# Patient Record
Sex: Male | Born: 1935 | Race: White | Hispanic: No | Marital: Married | Smoking: Never smoker
Health system: Southern US, Community
[De-identification: ages and names within clinical notes are randomized; demographics above are authoritative.]

## PROBLEM LIST (undated history)

## (undated) DIAGNOSIS — C61 Malignant neoplasm of prostate: Secondary | ICD-10-CM

## (undated) DIAGNOSIS — C801 Malignant (primary) neoplasm, unspecified: Secondary | ICD-10-CM

## (undated) DIAGNOSIS — E079 Disorder of thyroid, unspecified: Secondary | ICD-10-CM

## (undated) HISTORY — PX: KNEE ARTHROSCOPY: SHX127

## (undated) HISTORY — DX: Malignant (primary) neoplasm, unspecified: C80.1

## (undated) HISTORY — PX: APPENDECTOMY: SHX54

## (undated) HISTORY — PX: TOTAL HIP ARTHROPLASTY: SHX124

## (undated) HISTORY — PX: PROSTATE SURGERY: SHX751

## (undated) HISTORY — PX: HERNIA REPAIR: SHX51

---

## 1999-11-10 ENCOUNTER — Encounter: Payer: Self-pay | Admitting: Emergency Medicine

## 1999-11-10 ENCOUNTER — Observation Stay (HOSPITAL_COMMUNITY): Admission: EM | Admit: 1999-11-10 | Discharge: 1999-11-11 | Payer: Self-pay | Admitting: Emergency Medicine

## 1999-11-10 ENCOUNTER — Encounter: Payer: Self-pay | Admitting: Orthopedic Surgery

## 2000-05-15 ENCOUNTER — Ambulatory Visit (HOSPITAL_COMMUNITY): Admission: EM | Admit: 2000-05-15 | Discharge: 2000-05-15 | Payer: Self-pay | Admitting: Emergency Medicine

## 2000-05-15 ENCOUNTER — Encounter: Payer: Self-pay | Admitting: Specialist

## 2000-05-15 ENCOUNTER — Encounter: Payer: Self-pay | Admitting: Emergency Medicine

## 2000-07-05 ENCOUNTER — Encounter: Payer: Self-pay | Admitting: Orthopedic Surgery

## 2000-07-13 ENCOUNTER — Inpatient Hospital Stay (HOSPITAL_COMMUNITY): Admission: RE | Admit: 2000-07-13 | Discharge: 2000-07-16 | Payer: Self-pay | Admitting: Orthopedic Surgery

## 2002-02-28 ENCOUNTER — Encounter: Payer: Self-pay | Admitting: Orthopedic Surgery

## 2002-02-28 ENCOUNTER — Ambulatory Visit (HOSPITAL_COMMUNITY): Admission: EM | Admit: 2002-02-28 | Discharge: 2002-02-28 | Payer: Self-pay | Admitting: Emergency Medicine

## 2002-02-28 ENCOUNTER — Encounter: Payer: Self-pay | Admitting: Emergency Medicine

## 2003-05-29 ENCOUNTER — Encounter: Payer: Self-pay | Admitting: Emergency Medicine

## 2003-05-29 ENCOUNTER — Ambulatory Visit (HOSPITAL_COMMUNITY): Admission: EM | Admit: 2003-05-29 | Discharge: 2003-05-29 | Payer: Self-pay | Admitting: Emergency Medicine

## 2003-07-04 ENCOUNTER — Inpatient Hospital Stay (HOSPITAL_COMMUNITY): Admission: RE | Admit: 2003-07-04 | Discharge: 2003-07-06 | Payer: Self-pay | Admitting: Orthopedic Surgery

## 2010-03-17 ENCOUNTER — Ambulatory Visit (HOSPITAL_COMMUNITY): Admission: RE | Admit: 2010-03-17 | Discharge: 2010-03-17 | Payer: Self-pay | Admitting: Orthopedic Surgery

## 2010-12-08 ENCOUNTER — Inpatient Hospital Stay (HOSPITAL_COMMUNITY)
Admission: EM | Admit: 2010-12-08 | Discharge: 2010-12-24 | DRG: 338 | Disposition: A | Discharge status: Home Health | Payer: Medicare Other | Attending: General Surgery | Admitting: General Surgery

## 2010-12-08 ENCOUNTER — Emergency Department (HOSPITAL_COMMUNITY): Payer: Medicare Other

## 2010-12-08 DIAGNOSIS — I4891 Unspecified atrial fibrillation: Secondary | ICD-10-CM | POA: Diagnosis not present

## 2010-12-08 DIAGNOSIS — J9600 Acute respiratory failure, unspecified whether with hypoxia or hypercapnia: Secondary | ICD-10-CM | POA: Diagnosis not present

## 2010-12-08 DIAGNOSIS — R6520 Severe sepsis without septic shock: Secondary | ICD-10-CM | POA: Diagnosis present

## 2010-12-08 DIAGNOSIS — E876 Hypokalemia: Secondary | ICD-10-CM | POA: Diagnosis present

## 2010-12-08 DIAGNOSIS — D510 Vitamin B12 deficiency anemia due to intrinsic factor deficiency: Secondary | ICD-10-CM | POA: Diagnosis present

## 2010-12-08 DIAGNOSIS — N179 Acute kidney failure, unspecified: Secondary | ICD-10-CM | POA: Diagnosis present

## 2010-12-08 DIAGNOSIS — K3533 Acute appendicitis with perforation and localized peritonitis, with abscess: Principal | ICD-10-CM | POA: Diagnosis present

## 2010-12-08 DIAGNOSIS — I10 Essential (primary) hypertension: Secondary | ICD-10-CM | POA: Diagnosis present

## 2010-12-08 DIAGNOSIS — K929 Disease of digestive system, unspecified: Secondary | ICD-10-CM | POA: Diagnosis not present

## 2010-12-08 DIAGNOSIS — K560 Paralytic ileus: Secondary | ICD-10-CM | POA: Diagnosis not present

## 2010-12-08 DIAGNOSIS — Z880 Allergy status to penicillin: Secondary | ICD-10-CM

## 2010-12-08 DIAGNOSIS — J189 Pneumonia, unspecified organism: Secondary | ICD-10-CM | POA: Diagnosis present

## 2010-12-08 DIAGNOSIS — A419 Sepsis, unspecified organism: Secondary | ICD-10-CM | POA: Diagnosis present

## 2010-12-08 DIAGNOSIS — E039 Hypothyroidism, unspecified: Secondary | ICD-10-CM | POA: Diagnosis present

## 2010-12-08 LAB — LACTIC ACID, PLASMA: Lactic Acid, Venous: 2.3 mmol/L — ABNORMAL HIGH (ref 0.5–2.2)

## 2010-12-08 LAB — COMPREHENSIVE METABOLIC PANEL
ALT: 17 U/L (ref 0–53)
Alkaline Phosphatase: 67 U/L (ref 39–117)
CO2: 22 mEq/L (ref 19–32)
Calcium: 8.7 mg/dL (ref 8.4–10.5)
GFR calc non Af Amer: 46 mL/min — ABNORMAL LOW (ref >60)
Glucose, Bld: 111 mg/dL — ABNORMAL HIGH (ref 70–99)
Sodium: 136 mEq/L (ref 135–145)
Total Bilirubin: 1.3 mg/dL — ABNORMAL HIGH (ref 0.3–1.2)

## 2010-12-08 LAB — COMPREHENSIVE METABOLIC PANEL WITH GFR
AST: 30 U/L (ref 0–37)
Albumin: 3.1 g/dL — ABNORMAL LOW (ref 3.5–5.2)
BUN: 20 mg/dL (ref 6–23)
Chloride: 103 meq/L (ref 96–112)
Creatinine, Ser: 1.47 mg/dL (ref 0.4–1.5)
GFR calc Af Amer: 55 mL/min — ABNORMAL LOW (ref >60)
Potassium: 3.4 meq/L — ABNORMAL LOW (ref 3.5–5.1)
Total Protein: 6.6 g/dL (ref 6.0–8.3)

## 2010-12-08 LAB — PROTIME-INR
INR: 1.47 (ref 0.00–1.49)
Prothrombin Time: 18 s — ABNORMAL HIGH (ref 11.6–15.2)

## 2010-12-08 LAB — CBC
HCT: 35.6 % — ABNORMAL LOW (ref 39.0–52.0)
Hemoglobin: 12.2 g/dL — ABNORMAL LOW (ref 13.0–17.0)
MCH: 30.6 pg (ref 26.0–34.0)
MCHC: 34.3 g/dL (ref 30.0–36.0)
MCV: 89.2 fL (ref 78.0–100.0)
Platelets: 206 10*3/uL (ref 150–400)
RBC: 3.99 MIL/uL — ABNORMAL LOW (ref 4.22–5.81)
RDW: 14.3 % (ref 11.5–15.5)
WBC: 23.4 K/uL — ABNORMAL HIGH (ref 4.0–10.5)

## 2010-12-08 LAB — BRAIN NATRIURETIC PEPTIDE: Pro B Natriuretic peptide (BNP): 422 pg/mL — ABNORMAL HIGH (ref 0.0–100.0)

## 2010-12-08 LAB — CK TOTAL AND CKMB (NOT AT ARMC)
CK, MB: 2.8 ng/mL (ref 0.3–4.0)
Relative Index: 1.6 (ref 0.0–2.5)
Total CK: 179 U/L (ref 7–232)

## 2010-12-08 LAB — DIFFERENTIAL
Basophils Absolute: 0 K/uL (ref 0.0–0.1)
Basophils Relative: 0 % (ref 0–1)
Eosinophils Absolute: 0 10*3/uL (ref 0.0–0.7)
Eosinophils Relative: 0 % (ref 0–5)
Lymphocytes Relative: 2 % — ABNORMAL LOW (ref 12–46)
Lymphs Abs: 0.5 K/uL — ABNORMAL LOW (ref 0.7–4.0)
Monocytes Absolute: 1.4 K/uL — ABNORMAL HIGH (ref 0.1–1.0)
Monocytes Relative: 6 % (ref 3–12)
Neutro Abs: 21.5 10*3/uL — ABNORMAL HIGH (ref 1.7–7.7)
Neutrophils Relative %: 92 % — ABNORMAL HIGH (ref 43–77)
WBC Morphology: INCREASED

## 2010-12-08 LAB — APTT: aPTT: 31 seconds (ref 24–37)

## 2010-12-08 LAB — PROCALCITONIN: Procalcitonin: 34.28 ng/mL

## 2010-12-08 LAB — TROPONIN I: Troponin I: 0.09 ng/mL — ABNORMAL HIGH (ref 0.00–0.06)

## 2010-12-08 LAB — TSH: TSH: 0.925 u[IU]/mL (ref 0.350–4.500)

## 2010-12-08 LAB — MRSA PCR SCREENING: MRSA by PCR: NEGATIVE

## 2010-12-09 ENCOUNTER — Other Ambulatory Visit: Payer: Self-pay | Admitting: General Surgery

## 2010-12-09 ENCOUNTER — Encounter (HOSPITAL_COMMUNITY): Payer: Self-pay | Admitting: Radiology

## 2010-12-09 ENCOUNTER — Inpatient Hospital Stay (HOSPITAL_COMMUNITY): Payer: Medicare Other

## 2010-12-09 DIAGNOSIS — K3580 Unspecified acute appendicitis: Secondary | ICD-10-CM

## 2010-12-09 DIAGNOSIS — A419 Sepsis, unspecified organism: Secondary | ICD-10-CM

## 2010-12-09 DIAGNOSIS — R579 Shock, unspecified: Secondary | ICD-10-CM

## 2010-12-09 LAB — BASIC METABOLIC PANEL
BUN: 23 mg/dL (ref 6–23)
Chloride: 107 mEq/L (ref 96–112)
Glucose, Bld: 114 mg/dL — ABNORMAL HIGH (ref 70–99)
Potassium: 3.3 mEq/L — ABNORMAL LOW (ref 3.5–5.1)
Sodium: 136 mEq/L (ref 135–145)

## 2010-12-09 LAB — CARDIAC PANEL(CRET KIN+CKTOT+MB+TROPI)
CK, MB: 4.2 ng/mL — ABNORMAL HIGH (ref 0.3–4.0)
CK, MB: 4.5 ng/mL — ABNORMAL HIGH (ref 0.3–4.0)
CK, MB: 4.2 ng/mL — ABNORMAL HIGH (ref 0.3–4.0)
Relative Index: 1.7 (ref 0.0–2.5)
Total CK: 248 U/L — ABNORMAL HIGH (ref 7–232)
Total CK: 181 U/L (ref 7–232)
Troponin I: 0.07 ng/mL — ABNORMAL HIGH (ref 0.00–0.06)
Troponin I: 0.07 ng/mL — ABNORMAL HIGH (ref 0.00–0.06)
Troponin I: 0.06 ng/mL (ref 0.00–0.06)

## 2010-12-09 LAB — EXPECTORATED SPUTUM ASSESSMENT W GRAM STAIN, RFLX TO RESP C

## 2010-12-09 LAB — LIPID PANEL
Cholesterol: 76 mg/dL (ref 0–200)
HDL: 36 mg/dL — ABNORMAL LOW (ref >39)

## 2010-12-09 LAB — URINALYSIS, ROUTINE W REFLEX MICROSCOPIC
Bilirubin Urine: NEGATIVE
Hgb urine dipstick: NEGATIVE
Ketones, ur: NEGATIVE mg/dL
Protein, ur: NEGATIVE mg/dL
Urobilinogen, UA: 0.2 mg/dL (ref 0.0–1.0)

## 2010-12-09 LAB — CBC
HCT: 32 % — ABNORMAL LOW (ref 39.0–52.0)
MCV: 89.1 fL (ref 78.0–100.0)
Platelets: 199 10*3/uL (ref 150–400)
RBC: 3.59 MIL/uL — ABNORMAL LOW (ref 4.22–5.81)
WBC: 23.8 10*3/uL — ABNORMAL HIGH (ref 4.0–10.5)

## 2010-12-09 LAB — BRAIN NATRIURETIC PEPTIDE: Pro B Natriuretic peptide (BNP): 268 pg/mL — ABNORMAL HIGH (ref 0.0–100.0)

## 2010-12-09 LAB — TYPE AND SCREEN

## 2010-12-09 LAB — SURGICAL PCR SCREEN: MRSA, PCR: NEGATIVE

## 2010-12-09 MED ORDER — IOHEXOL 300 MG/ML  SOLN: 80.0000 mL | Freq: Once | INTRAMUSCULAR | Status: AC | PRN

## 2010-12-10 ENCOUNTER — Inpatient Hospital Stay (HOSPITAL_COMMUNITY): Payer: Medicare Other

## 2010-12-10 LAB — CARDIAC PANEL(CRET KIN+CKTOT+MB+TROPI)
CK, MB: 2.6 ng/mL (ref 0.3–4.0)
CK, MB: 2.6 ng/mL (ref 0.3–4.0)
Relative Index: 1.6 (ref 0.0–2.5)
Troponin I: 0.05 ng/mL (ref 0.00–0.06)

## 2010-12-10 LAB — COMPREHENSIVE METABOLIC PANEL
Alkaline Phosphatase: 52 U/L (ref 39–117)
BUN: 15 mg/dL (ref 6–23)
Calcium: 7.7 mg/dL — ABNORMAL LOW (ref 8.4–10.5)
Creatinine, Ser: 1.01 mg/dL (ref 0.4–1.5)
Glucose, Bld: 115 mg/dL — ABNORMAL HIGH (ref 70–99)
Total Protein: 4.8 g/dL — ABNORMAL LOW (ref 6.0–8.3)

## 2010-12-10 LAB — DIFFERENTIAL
Eosinophils Relative: 0 % (ref 0–5)
Lymphocytes Relative: 7 % — ABNORMAL LOW (ref 12–46)
Lymphs Abs: 0.7 10*3/uL (ref 0.7–4.0)
Monocytes Absolute: 0.6 10*3/uL (ref 0.1–1.0)

## 2010-12-10 LAB — MAGNESIUM: Magnesium: 1.6 mg/dL (ref 1.5–2.5)

## 2010-12-10 LAB — PHOSPHORUS: Phosphorus: 2.5 mg/dL (ref 2.3–4.6)

## 2010-12-10 LAB — CBC
HCT: 31.3 % — ABNORMAL LOW (ref 39.0–52.0)
MCH: 31.2 pg (ref 26.0–34.0)
MCHC: 35.1 g/dL (ref 30.0–36.0)
MCV: 88.7 fL (ref 78.0–100.0)
RDW: 14.9 % (ref 11.5–15.5)

## 2010-12-10 LAB — BRAIN NATRIURETIC PEPTIDE: Pro B Natriuretic peptide (BNP): 664 pg/mL — ABNORMAL HIGH (ref 0.0–100.0)

## 2010-12-10 LAB — URINE CULTURE: Culture  Setup Time: 201204100901

## 2010-12-10 LAB — GLUCOSE, CAPILLARY: Glucose-Capillary: 133 mg/dL — ABNORMAL HIGH (ref 70–99)

## 2010-12-11 ENCOUNTER — Inpatient Hospital Stay (HOSPITAL_COMMUNITY): Payer: Medicare Other

## 2010-12-11 LAB — BASIC METABOLIC PANEL
CO2: 24 mEq/L (ref 19–32)
Calcium: 8.1 mg/dL — ABNORMAL LOW (ref 8.4–10.5)
Creatinine, Ser: 1.08 mg/dL (ref 0.4–1.5)
Glucose, Bld: 127 mg/dL — ABNORMAL HIGH (ref 70–99)

## 2010-12-11 LAB — CARDIAC PANEL(CRET KIN+CKTOT+MB+TROPI): CK, MB: 2.7 ng/mL (ref 0.3–4.0)

## 2010-12-11 LAB — CBC
HCT: 33.4 % — ABNORMAL LOW (ref 39.0–52.0)
MCHC: 33.8 g/dL (ref 30.0–36.0)
MCV: 87.7 fL (ref 78.0–100.0)
RDW: 14.7 % (ref 11.5–15.5)

## 2010-12-11 LAB — DIFFERENTIAL
Eosinophils Relative: 1 % (ref 0–5)
Lymphocytes Relative: 6 % — ABNORMAL LOW (ref 12–46)
Lymphs Abs: 0.6 10*3/uL — ABNORMAL LOW (ref 0.7–4.0)
Monocytes Absolute: 0.7 10*3/uL (ref 0.1–1.0)
Monocytes Relative: 7 % (ref 3–12)

## 2010-12-11 LAB — PROCALCITONIN: Procalcitonin: 4.44 ng/mL

## 2010-12-11 NOTE — H&P (Signed)
NAME:  Jordan [name], Jordan [name]               ACCOUNT NO.:  [id number]  MEDICAL RECORD NO.:  [id number]           PATIENT TYPE:  I  LOCATION:  2915                         FACILITY:  [facility]  PHYSICIAN:  Hillery Aldo, M.D.   DATE OF BIRTH:  Jun 11, 1924  DATE OF ADMISSION:  12/08/2010 DATE OF DISCHARGE:                             HISTORY & PHYSICAL   PRIMARY CARE PROVIDER:  Tommas Olp at [facility] medical specialist.  CHIEF COMPLAINT:  Chest discomfort.  HISTORY OF PRESENT ILLNESS:  The patient is an 77 year old male who was sent here from [facility] with a diagnosis of pneumonia and elevated troponins.  Because [facility] does not have a cardiac cath lab or capabilities to deal with acute coronary syndrome patients, the patient was transferred here for evaluation.  The patient is weak, but states that his symptoms began approximately 24 hours ago with what he describes as a low grade epigastric pain and pain in the lower chest area.  He denies any associated shortness of breath or fever, but notes that he did have some chilling last night.  He has a nonproductive cough that is mild.  Denies any sick contacts.  The patient states that he also had two episodes of nausea and vomiting last night for which he took Pepto-Bismol and no associated diarrhea.  The patient currently is denying any chest pain, but looks mildly toxic.  He is being admitted for treatment of sepsis secondary to community-acquired pneumonia.  PAST MEDICAL HISTORY: 1. Hypothyroidism. 2. Prostate cancer, status post prostatectomy in 1989. 3. Pernicious anemia. 4. History of pneumonia. 5. History of shingles. 6. History of jaundice. 7. Status post right knee replacement in 1949. 8. Herniorrhaphy x2. 9. Left total hip replacement in 2000 followed by revision in 2001 and     then conversion to a constrained modality in 2004.  FAMILY HISTORY:  The patient's father died at 77 from colon cancer.  The patient's  mother died at 70 from old age.  He has one brother who he thinks died of a stroke and a sister who is alive.  SOCIAL HISTORY:  The patient is married.  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  He is a lifelong nonsmoker with no history of alcohol or drug abuse.  ALLERGIES: 1. PENICILLIN causes a rash. 2. TETANUS (horse serum).  CURRENT MEDICATIONS:  The patient is unable to state, but the records say that he is on betaxolol, vitamin B12 shots, and Synthroid at uncertain doses.  REVIEW OF SYSTEMS:  CONSTITUTIONAL:  No changes in his appetite.  No weight loss or weight gain.  Poor energy for the last several days. HEENT:  Denies pharyngitis.  Denies visual problems or hearing problems. CARDIOVASCULAR:  Mild chest discomfort in the past 24 hours, none present.  No palpitations.  RESPIRATORY:  No shortness of breath.  Mild nonproductive cough.  GI:  No current nausea or vomiting, but he did have two episodes last evening.  No diarrhea, melena, or hematochezia. No hematemesis.  GU:  No dysuria or hematuria.  A comprehensive 14-point review of systems is  otherwise unremarkable except as noted above.  PHYSICAL EXAMINATION:  VITAL SIGNS:  Temperature 98.4, pulse 96, respirations 14, blood pressure 95/53, O2 saturation 94% on room air. GENERAL:  Mildly toxic Caucasian male who is in no acute distress. HEENT:  Normocephalic, atraumatic.  PERRL.  EOMI.  Oropharynx reveals some mild posterior pharyngeal erythema, but is otherwise unremarkable. Mucous membranes are moist. NECK:  Supple, no thyromegaly, no lymphadenopathy, no jugular venous distention. CHEST:  Diminished breath sounds, clear bilaterally. HEART:  Regular rate, rhythm.  No murmurs, rubs, or gallops. ABDOMEN:  Soft, nontender, nondistended with normoactive bowel sounds. EXTREMITIES:  Trace edema. SKIN:  Dry.  No rashes. NEUROLOGIC:  The patient is alert and oriented x3.  Cranial nerves  II through XII grossly intact.  Generalized weakness.  Nonfocal.  DATA REVIEW:  Chest x-ray shows mild interstitial prominence in the medial right upper lobe which is atypical, but might represent a mild pneumonia.  A 12-lead EKG shows normal sinus rhythm at 67 beats per minute with no ST or T-wave abnormalities appreciated.  LABORATORY DATA:  BNP was 422.  White blood cell count is 23.4, hemoglobin 12.2, hematocrit 35.6, platelets 206.  Sodium is 136, potassium 3.4, chloride 103, bicarb 22, BUN 20, creatinine 1.47, glucose 111, calcium 8.7, total bilirubin 1.3, alkaline phosphatase 67, AST 30, ALT 17, total protein 6.6, albumin 3.1.  Cardiac markers are significant for an elevated troponin of 0.09.  CK is 179 and MB is 2.5.  PT is 18, PTT 31.  Lactic acid is 2.3 and procalcitonin is 34.28.  ASSESSMENT AND PLAN: 1. Sepsis secondary to community-acquired pneumonia:  We will admit     the patient to the step-down unit and cover him with Avelox since     he has not had any healthcare exposure.  We will obtain two sets of     blood cultures.  The patient is not currently producing sputum, but     if he does, we would collect sputum specimen.  We will monitor him     closely for signs of resolution of his sepsis syndrome by     rechecking a procalcitonin level in the morning. 2. Elevated troponin secondary to demand ischemia:  The patient likely     has a troponin leak from his sepsis and underlying demand ischemia.     We will place the patient on aspirin therapy and cycle cardiac     markers q.6 h. x3 sets.  We will check a two-dimensional     echocardiogram to evaluate his left ventricular function.  We will     check a fasting lipid panel to further risk stratify him. 3. Elevated BNP:  Again, the patient likely has demand ischemia and     some BNP elevation secondary to diastolic heart failure from sepsis     and demand ischemia.  At this point, we will check a two-     dimensional  echocardiogram to rule out systolic dysfunction and     recheck a BNP in the morning.  The patient does not appear to be     clinically volume overloaded and would gently hydrate him. 4. Acute renal failure versus stage III chronic kidney disease:  The     patient's baseline renal function is not currently known.  We will     gently hydrate him and monitor his creatinine closely with gentle     hydration. 5. Hypokalemia:  We will place potassium in the patient's IV fluids  and monitor his electrolytes closely. 6. Hypothyroidism:  We will resume the patient's usual dose of     Synthroid once it has been verified and check a TSH level. 7. Pernicious anemia:  Appears to be well controlled given his     hemoglobin of 12.2. 8. Prophylaxis:  We will place the patient on Lovenox for deep venous     thrombosis prophylaxis.  Time spent on admission including face-to-face time equals approximately 1 hour.     Hillery Aldo, M.D.     CR/MEDQ  D:  12/08/2010  T:  12/09/2010  Job:  [id number]  cc:   Tommas Olp  Electronically Signed by Hillery Aldo M.D. on 12/11/2010 07:20:13 AM

## 2010-12-12 LAB — DIFFERENTIAL
Basophils Relative: 0 % (ref 0–1)
Lymphs Abs: 1 10*3/uL (ref 0.7–4.0)
Monocytes Relative: 9 % (ref 3–12)
Neutro Abs: 7.5 10*3/uL (ref 1.7–7.7)
Neutrophils Relative %: 75 % (ref 43–77)

## 2010-12-12 LAB — COMPREHENSIVE METABOLIC PANEL
ALT: 14 U/L (ref 0–53)
AST: 19 U/L (ref 0–37)
Albumin: 1.9 g/dL — ABNORMAL LOW (ref 3.5–5.2)
CO2: 24 mEq/L (ref 19–32)
Calcium: 7.8 mg/dL — ABNORMAL LOW (ref 8.4–10.5)
Creatinine, Ser: 0.92 mg/dL (ref 0.4–1.5)
GFR calc Af Amer: >60 mL/min (ref >60)
GFR calc non Af Amer: >60 mL/min (ref >60)
Sodium: 139 mEq/L (ref 135–145)
Total Protein: 5.1 g/dL — ABNORMAL LOW (ref 6.0–8.3)

## 2010-12-12 LAB — CBC
Hemoglobin: 10.9 g/dL — ABNORMAL LOW (ref 13.0–17.0)
MCH: 30 pg (ref 26.0–34.0)
RBC: 3.63 MIL/uL — ABNORMAL LOW (ref 4.22–5.81)
WBC: 10 10*3/uL (ref 4.0–10.5)

## 2010-12-12 LAB — BODY FLUID CULTURE

## 2010-12-13 LAB — CBC
HCT: 32.6 % — ABNORMAL LOW (ref 39.0–52.0)
Hemoglobin: 11 g/dL — ABNORMAL LOW (ref 13.0–17.0)
MCH: 29.6 pg (ref 26.0–34.0)
MCHC: 33.7 g/dL (ref 30.0–36.0)
MCV: 87.9 fL (ref 78.0–100.0)
RDW: 14.7 % (ref 11.5–15.5)

## 2010-12-13 LAB — RENAL FUNCTION PANEL
BUN: 16 mg/dL (ref 6–23)
CO2: 25 mEq/L (ref 19–32)
Calcium: 7.9 mg/dL — ABNORMAL LOW (ref 8.4–10.5)
Creatinine, Ser: 0.88 mg/dL (ref 0.4–1.5)
Glucose, Bld: 111 mg/dL — ABNORMAL HIGH (ref 70–99)
Phosphorus: 2.5 mg/dL (ref 2.3–4.6)

## 2010-12-14 ENCOUNTER — Inpatient Hospital Stay (HOSPITAL_COMMUNITY): Payer: Medicare Other

## 2010-12-14 LAB — ANAEROBIC CULTURE

## 2010-12-15 ENCOUNTER — Inpatient Hospital Stay (HOSPITAL_COMMUNITY): Payer: Medicare Other

## 2010-12-15 LAB — CULTURE, BLOOD (ROUTINE X 2)
Culture  Setup Time: 201204100417
Culture  Setup Time: 201204100417
Culture: NO GROWTH
Culture: NO GROWTH

## 2010-12-15 LAB — RENAL FUNCTION PANEL
Albumin: 2.3 g/dL — ABNORMAL LOW (ref 3.5–5.2)
BUN: 9 mg/dL (ref 6–23)
Chloride: 107 mEq/L (ref 96–112)
Glucose, Bld: 119 mg/dL — ABNORMAL HIGH (ref 70–99)
Phosphorus: 3.2 mg/dL (ref 2.3–4.6)
Potassium: 4 mEq/L (ref 3.5–5.1)
Sodium: 138 mEq/L (ref 135–145)

## 2010-12-15 LAB — CBC
HCT: 37.6 % — ABNORMAL LOW (ref 39.0–52.0)
MCH: 29.8 pg (ref 26.0–34.0)
MCV: 88.3 fL (ref 78.0–100.0)
Platelets: 450 10*3/uL — ABNORMAL HIGH (ref 150–400)
RDW: 14.9 % (ref 11.5–15.5)
WBC: 11.7 10*3/uL — ABNORMAL HIGH (ref 4.0–10.5)

## 2010-12-15 LAB — CARDIAC PANEL(CRET KIN+CKTOT+MB+TROPI): Total CK: 27 U/L (ref 7–232)

## 2010-12-15 NOTE — Op Note (Signed)
NAME:  [name], Jordan               ACCOUNT NO.:  [id number]  MEDICAL RECORD NO.:  [id number]           PATIENT TYPE:  I  LOCATION:  2915                         FACILITY:  [facility]  PHYSICIAN:  Anselm Pancoast. Svea Pusch, M.D.DATE OF BIRTH:  1924-02-19  DATE OF PROCEDURE:  12/08/2010 DATE OF DISCHARGE:                              OPERATIVE REPORT   PREOPERATIVE DIAGNOSIS:  Appendicitis, probably ruptured.  POSTOPERATIVE DIAGNOSIS:  Ruptured appendicitis with generalized peritonitis.  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 Mikey Bussing had seen him and the Family request of [facility].  He arrived here having had a electrolytes at Dr. Neita Garnet office, [facility] and his creatinine was 1.6, white count 17,600, and he is a nonsmoker and complained of some shortness of breath or kind of pain with breathing and some difficulty with urinating, he has had a previous prostate surgery for cancer a year ago and arrived here in the ER approximately 2:00 p.m. on Monday, he was hypotensive at the time of blood pressure of 91/51, a pulse of 68, then IV fluids were started. His pressure hovered around basically a 100 range and he was evaluated by the ER physician and I think they thought he had a community-acquired pneumonia, anyway he was admitted by the hospitalist and they started him on I think Cipro and Flagyl.  He is allergic to PENICILLIN and then obtained a CT whether, I do not know it was whether it was last night or early this morning, that showed obviously swollen appendix and fluid consistent with appendicitis.  Surgical consultation was requested.  He was seen by Dr. Abbey Chatters  and the PAs earlier today.  They had fed him and Dr. Abbey Chatters has a kind of opinion that it was not really of emergency, he of course has been had a soft blood pressure.  They had done an echocardiogram and since he had eaten, they were not able to take him to the operating room right at that time.  As Dr. Abbey Chatters put him n.p.o. and suggested that after a 6-hour await that we will proceed our surgery.  Dr. Magnus Ivan asked me that we will do the surgery since he is recently injured his wrist and thought to pull in would be difficult.  On physical exam, the patient is very tender in the lower abdomen, predominantly more so on the right and review of the CT there is a lot of fluid and this looks like probably a ruptured appendicitis.  I took him back to the OR suite.  Induction of general anesthesia, endotracheal tube, oral tube was placed into the stomach.  Foley catheter was inserted sterilely and then the abdomen was prepped and draped in a sterile manner.  I made a little small incision below the umbilicus down through  the fascia above where he had his open prostatectomy and then I carefully entered into the peritoneal cavity, but and making a little incision it was obviously generalized peritoneal infections fluid after I opened into the peritoneal cavity.  I put Hasson cannula held in place with a pursestring and then put the camera and we were able to just to see an markedly inflamed lot of issues throughout the peritoneal cavity and I think that it would be better to go ahead and just opened the midline incision.  Dr. Magnus Ivan scrubbed in at this point and using the cautery we opened the midline incision probably about a 3-inch length the fascia that because all which previous surgery unfortunately has some adhesions up to the undersurface and then I could get my fingers in.  We could feel the markedly inflamed appendix and there was a fecalith right at the base where he  has ruptured appendix.  We took cultures aerobic and anaerobically of the peritoneal fluid and then with kind of finger manipulated the mesentery of the appendix and kind of pushed the loops of small bowel away and then put Kelly's on the mesentery of the appendix.  This was tied with 2- 0 Vicryl and then we got more proximal where the little perforation was and then clamped the base of the appendix with a Tresa Endo.  I put 2 free ties of 2-0 Vicryl, removed the ruptured appendix, and then put a pursestring 2-0 silk to stump inverted with a pursestring suture tie.  I put a second little stitch to kind of reinforced the area and then this thoroughly irrigated with by 4 liters of saline not warm since when we induced with general anesthesia, the nurse had measured him with an temperature of 104.  His temperature was down about 102 at the completion of surgery.  Estimated blood loss was minimal.  Sponge and needle counts were correct and then after breaking up loculations up above the liver on the right and up in the around the stomach make sure that the nasogastric tube is within the stomach and then put a 19-Blake in the right lower quadrant just because I think he will have more fluid draining out because of all the washing and it is hard to get all the fluid out.  I then closed the midline fascia with single interrupted sutures of #1 Novafil.  The wound was left open and packed with Betadine- soaked gauze 4x4s.  The patient's wife says she is not sure exactly why or how he is allergic to penicillin and since he has been on Cipro now about 2 days and still running such febrile.  I wonder if Primaxin might be a better drug in this 77 year old with this generalized peritonitis. I will discuss this with the pharmacist and he will return back to 2900 after his time in the recovery room.     Anselm Pancoast. Zachery Dakins, M.D.     WJW/MEDQ  D:  12/09/2010  T:  12/10/2010  Job:  [id number]  cc:    Wannetta Sender.  Electronically Signed by Consuello Bossier M.D. on 12/15/2010 08:58:27 AM

## 2010-12-16 LAB — URINALYSIS, ROUTINE W REFLEX MICROSCOPIC
Hgb urine dipstick: NEGATIVE
Nitrite: NEGATIVE
Protein, ur: NEGATIVE mg/dL
Specific Gravity, Urine: 1.029 (ref 1.005–1.030)
Urobilinogen, UA: 1 mg/dL (ref 0.0–1.0)

## 2010-12-16 LAB — CREATININE, FLUID (PLEURAL, PERITONEAL, JP DRAINAGE): Creat, Fluid: 0.7 mg/dL

## 2010-12-16 LAB — DIFFERENTIAL
Basophils Absolute: 0 10*3/uL (ref 0.0–0.1)
Eosinophils Absolute: 0.4 10*3/uL (ref 0.0–0.7)
Eosinophils Relative: 3 % (ref 0–5)
Lymphocytes Relative: 11 % — ABNORMAL LOW (ref 12–46)
Lymphs Abs: 1.2 10*3/uL (ref 0.7–4.0)
Neutrophils Relative %: 77 % (ref 43–77)

## 2010-12-16 LAB — MAGNESIUM: Magnesium: 1.9 mg/dL (ref 1.5–2.5)

## 2010-12-16 LAB — BASIC METABOLIC PANEL
Chloride: 107 mEq/L (ref 96–112)
GFR calc Af Amer: >60 mL/min (ref >60)
GFR calc non Af Amer: >60 mL/min (ref >60)
Potassium: 3.5 mEq/L (ref 3.5–5.1)
Sodium: 136 mEq/L (ref 135–145)

## 2010-12-16 LAB — PREALBUMIN: Prealbumin: 11 mg/dL — ABNORMAL LOW (ref 17.0–34.0)

## 2010-12-16 LAB — CBC
Platelets: 510 10*3/uL — ABNORMAL HIGH (ref 150–400)
RBC: 3.99 MIL/uL — ABNORMAL LOW (ref 4.22–5.81)
RDW: 14.6 % (ref 11.5–15.5)
WBC: 10.8 10*3/uL — ABNORMAL HIGH (ref 4.0–10.5)

## 2010-12-17 LAB — CBC
MCV: 86.8 fL (ref 78.0–100.0)
Platelets: 571 10*3/uL — ABNORMAL HIGH (ref 150–400)
RDW: 14.6 % (ref 11.5–15.5)
WBC: 11.6 10*3/uL — ABNORMAL HIGH (ref 4.0–10.5)

## 2010-12-17 LAB — BASIC METABOLIC PANEL
BUN: 8 mg/dL (ref 6–23)
GFR calc Af Amer: >60 mL/min (ref >60)
GFR calc non Af Amer: >60 mL/min (ref >60)
Potassium: 3.3 mEq/L — ABNORMAL LOW (ref 3.5–5.1)

## 2010-12-17 NOTE — Group Therapy Note (Signed)
NAME:  Jordan [name], Jordan [name]               ACCOUNT NO.:  [id number]  MEDICAL RECORD NO.:  [id number]           PATIENT TYPE:  I  LOCATION:  5121                         FACILITY:  [facility]  PHYSICIAN:  Marinda Elk, M.D.DATE OF BIRTH:  Mar 07, 1924                                PROGRESS NOTE   PRIMARY CARE DOCTOR: Dr. Gigi Gin __________Medical  This is a progress note that covers from December 10, 2010 to December 16, 2010.  DIAGNOSES: 1. Septic shock. 2. Perforated appendix. 3. Acute hypoxic respiratory failure secondary to aspiration     pneumonia. 4. Hypertension. 5. Hypothyroidism. 6. Pernicious anemia.  PROCEDURES PERFORMED: 1. CT scan of the abdomen and pelvis that showed interval surgery with     placement of drainage.  Posteroinferior to the tip of the drainage     catheter, there is a right paracentral presacral fluid collection,     abscess versus postoperative fluid. 2. Chest x-ray showed persistent lower lobe atelectasis versus     pneumonia. 3. April 12 showed no changes in left lower lobe infiltrates, slight     improvement in right upper lobe infiltrate. 4. December 10, 2010, significant worsening of bilateral pneumonia. 5. CT scan of the abdomen, December 09, 2010, showed findings compatible     with acute appendicitis. 6. Chest x-ray showed mild interstitial prominence in the medial right     upper lobe. Although atypical, this could represent mild pneumonia. 7. Exploratory laparotomy for ruptured appendix, December 10 2010, op     report exploratory laparotomy for ruptured appendix with     generalized peritonitis.  HOSPITAL COURSE: 1. Septic shock, probably secondary to ruptured appendix.  CCM was     consulted.  Etiology of shock was unclear.  He was put on pressors.     Cultures were sent, was started on Cipro and Flagyl.  They were     convinced it was GI source.  He was started on dopamine.  Then a CT     scan was done that showed ruptured appendix.  He was taken to     Surgery, was clean.  He remained off pressors and he was continued     on antibiotic.  This has resolved. 2. Perforated appendix.  After CT scan was done, he was taken to     surgery and left drain in having minimal drainage. CT abdomen was done     that showed fluid collection cannot rule out absces, patient has an ileus     and had a fever for which his antibiotics had to be change.This was     followed up by Surgery. 3. Acute hypoxic respiratory failure.  There was concern at first it     was probably secondary overload to the fluids given for septic     shock.  The chest x-ray before starting on fluids showed a possible     pneumonia with questionable aspiration.  So, he was started on     Cipro and Flagyl changed to vanc  and Zosyn.  The     cultures from the perforated appendix grew E. coli that was  pansensitive, so he was put on Levaquin for quinolones to cover     lungs and belly, and Flagyl was kept for any anaerobes from the     ruptured appendix. 4. Hypertension.  His blood pressure is stable off pressors.  We will     continue to hold his blood pressure medication. 5. Hypothyroidism, currently stable.  No changes will be made. 6. Pernicious anemia.  His hemoglobin is stable.  No changes.  We     asked PT is see him and they recommended home health.     Marinda Elk, M.D.     AF/MEDQ  D:  12/16/2010  T:  12/16/2010  Job:  [id number]  Electronically Signed by Lambert Keto M.D. on 12/17/2010 04:54:09 AM

## 2010-12-18 ENCOUNTER — Inpatient Hospital Stay (HOSPITAL_COMMUNITY): Payer: Medicare Other

## 2010-12-18 LAB — BASIC METABOLIC PANEL
Chloride: 107 mEq/L (ref 96–112)
GFR calc Af Amer: >60 mL/min (ref >60)
GFR calc non Af Amer: >60 mL/min (ref >60)
Sodium: 136 mEq/L (ref 135–145)

## 2010-12-19 LAB — DIFFERENTIAL
Basophils Relative: 1 % (ref 0–1)
Lymphs Abs: 1.5 10*3/uL (ref 0.7–4.0)
Monocytes Absolute: 0.9 10*3/uL (ref 0.1–1.0)
Monocytes Relative: 9 % (ref 3–12)
Neutro Abs: 7.4 10*3/uL (ref 1.7–7.7)
Neutrophils Relative %: 72 % (ref 43–77)

## 2010-12-19 LAB — MAGNESIUM: Magnesium: 1.8 mg/dL (ref 1.5–2.5)

## 2010-12-19 LAB — GLUCOSE, CAPILLARY
Glucose-Capillary: 118 mg/dL — ABNORMAL HIGH (ref 70–99)
Glucose-Capillary: 107 mg/dL — ABNORMAL HIGH (ref 70–99)
Glucose-Capillary: 102 mg/dL — ABNORMAL HIGH (ref 70–99)

## 2010-12-19 LAB — CBC
HCT: 32.6 % — ABNORMAL LOW (ref 39.0–52.0)
Hemoglobin: 11.2 g/dL — ABNORMAL LOW (ref 13.0–17.0)
MCH: 29.9 pg (ref 26.0–34.0)
MCHC: 34.4 g/dL (ref 30.0–36.0)
MCV: 87.2 fL (ref 78.0–100.0)
RBC: 3.74 MIL/uL — ABNORMAL LOW (ref 4.22–5.81)

## 2010-12-19 LAB — COMPREHENSIVE METABOLIC PANEL
AST: 33 U/L (ref 0–37)
BUN: 7 mg/dL (ref 6–23)
CO2: 24 mEq/L (ref 19–32)
Calcium: 7.6 mg/dL — ABNORMAL LOW (ref 8.4–10.5)
Chloride: 107 mEq/L (ref 96–112)
Creatinine, Ser: 0.8 mg/dL (ref 0.4–1.5)
GFR calc Af Amer: >60 mL/min (ref >60)
GFR calc non Af Amer: >60 mL/min (ref >60)
Glucose, Bld: 118 mg/dL — ABNORMAL HIGH (ref 70–99)
Total Bilirubin: 0.5 mg/dL (ref 0.3–1.2)

## 2010-12-19 LAB — TRIGLYCERIDES: Triglycerides: 61 mg/dL (ref <150)

## 2010-12-19 LAB — CHOLESTEROL, TOTAL: Cholesterol: 75 mg/dL (ref 0–200)

## 2010-12-19 LAB — PREALBUMIN: Prealbumin: 11 mg/dL — ABNORMAL LOW (ref 17.0–34.0)

## 2010-12-20 LAB — BASIC METABOLIC PANEL
BUN: 8 mg/dL (ref 6–23)
CO2: 24 mEq/L (ref 19–32)
Calcium: 7.7 mg/dL — ABNORMAL LOW (ref 8.4–10.5)
Creatinine, Ser: 0.74 mg/dL (ref 0.4–1.5)
Glucose, Bld: 113 mg/dL — ABNORMAL HIGH (ref 70–99)

## 2010-12-20 LAB — GLUCOSE, CAPILLARY
Glucose-Capillary: 116 mg/dL — ABNORMAL HIGH (ref 70–99)
Glucose-Capillary: 93 mg/dL (ref 70–99)

## 2010-12-21 LAB — GLUCOSE, CAPILLARY
Glucose-Capillary: 125 mg/dL — ABNORMAL HIGH (ref 70–99)
Glucose-Capillary: 94 mg/dL (ref 70–99)

## 2010-12-21 LAB — BASIC METABOLIC PANEL
BUN: 10 mg/dL (ref 6–23)
GFR calc non Af Amer: >60 mL/min (ref >60)
Glucose, Bld: 115 mg/dL — ABNORMAL HIGH (ref 70–99)
Potassium: 3.6 mEq/L (ref 3.5–5.1)

## 2010-12-21 LAB — CBC
HCT: 33.2 % — ABNORMAL LOW (ref 39.0–52.0)
MCH: 29.9 pg (ref 26.0–34.0)
MCV: 87.8 fL (ref 78.0–100.0)
RDW: 15.3 % (ref 11.5–15.5)
WBC: 9.1 10*3/uL (ref 4.0–10.5)

## 2010-12-22 LAB — DIFFERENTIAL
Basophils Absolute: 0.1 10*3/uL (ref 0.0–0.1)
Basophils Relative: 1 % (ref 0–1)
Eosinophils Relative: 7 % — ABNORMAL HIGH (ref 0–5)
Monocytes Absolute: 1.3 10*3/uL — ABNORMAL HIGH (ref 0.1–1.0)

## 2010-12-22 LAB — GLUCOSE, CAPILLARY
Glucose-Capillary: 109 mg/dL — ABNORMAL HIGH (ref 70–99)
Glucose-Capillary: 123 mg/dL — ABNORMAL HIGH (ref 70–99)
Glucose-Capillary: 131 mg/dL — ABNORMAL HIGH (ref 70–99)

## 2010-12-22 LAB — COMPREHENSIVE METABOLIC PANEL
ALT: 15 U/L (ref 0–53)
AST: 18 U/L (ref 0–37)
Calcium: 8.1 mg/dL — ABNORMAL LOW (ref 8.4–10.5)
GFR calc Af Amer: >60 mL/min (ref >60)
Sodium: 136 mEq/L (ref 135–145)
Total Protein: 4.7 g/dL — ABNORMAL LOW (ref 6.0–8.3)

## 2010-12-22 LAB — PHOSPHORUS: Phosphorus: 3.8 mg/dL (ref 2.3–4.6)

## 2010-12-22 LAB — CBC
MCHC: 34.3 g/dL (ref 30.0–36.0)
RDW: 15.3 % (ref 11.5–15.5)

## 2010-12-23 LAB — GLUCOSE, CAPILLARY: Glucose-Capillary: 114 mg/dL — ABNORMAL HIGH (ref 70–99)

## 2011-01-06 NOTE — Discharge Summary (Signed)
NAME:  Jordan [name], Jordan [name]               ACCOUNT NO.:  [id number]  MEDICAL RECORD NO.:  [id number]           PATIENT TYPE:  I  LOCATION:  5121                         FACILITY:  [facility]  PHYSICIAN:  Almond Lint, MD       DATE OF BIRTH:  [birth year]  DATE OF ADMISSION:  12/08/2010 DATE OF DISCHARGE:  12/24/2010                              DISCHARGE SUMMARY   This is a discharge summary that covers the hospitalization from December 16, 2010 to the time of discharge December 24, 2010.  Please see interim discharge summary on December 16, 2010 by Dr. David Stall for prior hospital course information.  ADMITTING PHYSICIAN:  Hillery Aldo, MD.  DISCHARGE PHYSICIAN:  Almond Lint, MD.  CONSULTANTS:  Adolph Pollack, MD on December 09, 2010.  PROCEDURES:  Open appendectomy by Dr. Consuello Bossier on December 09, 2010.  HOSPITAL COURSE:  Taking up from December 16, 2010, [name] is an 77- year-old male that presented with a ruptured appendix.  He underwent an open appendectomy with subsequent septic shock.  As of April 17, the patient was postoperative day #8.  He was improving at this time and was no longer in shock.  He was continuing to have a postoperative ileus. He did have a JP drain in place, which was draining only clear serous fluid.  He did have a repeat CT scan within the last day or so to evaluate for postoperative complications.  He did have a right paracentral presacral fluid collection, which measured 4.3 x 4 x 3.8 cm, which likely represented a postoperative fluid collection as opposed to an abscess although that could not be ruled out.  However, over the next several days, the patient's postoperative ileus began to resolve.  On postoperative day #8, he was allowed to have sips of liquids.  However, the initial trial of sips did not go very well.  He was placed on TNA due to a prolonged postoperative ileus.  He was eventually placed on Reglan on postoperative day #9.  By postoperative  day #11, the patient was passing flatus.  He was tolerating clear liquids well at this point. Over the next several days, his diet was advanced as tolerated.  On postoperative day #13, his JP drain was discontinued as his JP output fell, but was continuing to only drain clear serous fluid.  His abdomen was soft and essentially nontender.  He did have an open wound which was being packed twice a day.  By postoperative day #15, the patient was tolerating a regular diet and was otherwise felt stable for discharge home from a surgical standpoint.  The patient was deconditioned.  Physical therapy and occupational therapy were requested.  It was felt that the patient needed home health physical therapy, occupational therapy, and nursing assistance for wound care.  The patient's other medical problems including respiratory failure, hypertension, hypothyroidism, and pernicious anemia remained stable throughout the rest to the hospitalization.  DISCHARGE DIAGNOSES: 1. Ruptured appendix with peritonitis and subsequent septic shock. 2. Acute respiratory failure, which is resolved. 3. Hypertension. 4. Hypothyroidism. 5. Pernicious anemia. 6. Status post open appendectomy with  open wound. 7. Prolonged postoperative ileus, which is resolved.  DISCHARGE MEDICATIONS:  Please see medication reconciliation form.  DISCHARGE INSTRUCTIONS:  The patient may increase his activity slowly and walk up steps.  He may shower, however, he is not to bathe for the next 2 weeks.  He is not to drive for the next 2-3 weeks.  He is not to do any heavy lifting over 15 pounds for the next 4-6 weeks.  He has no dietary restrictions.  He is to follow up with Dr. Consuello Bossier in our office in 2 weeks and he is to call for that appointment.  He is to follow up with Tommas Olp, he was a PA, who is his primary medical provider, within the next 1-2 weeks as well for post hospital followup. He is to call our office  for fever greater than 101.5 or worsening abdominal pain.     Letha Cape, PA   ______________________________ Almond Lint, MD    KEO/MEDQ  D:  12/24/2010  T:  12/24/2010  Job:  [id number]  cc:   Anselm Pancoast. Zachery Dakins, M.D. Tommas Olp, PA  Electronically Signed by Barnetta Chapel PA on 01/02/2011 03:55:33 PM Electronically Signed by Almond Lint MD on 01/06/2011 03:55:32 PM

## 2011-01-16 NOTE — H&P (Signed)
[facility]  Patient:    Jordan [name], Jordan [name]                        MRN: [id number] Adm. Date:  07/13/00 Attending:  Fayrene Fearing P. Aplington, M.D. Dictator:   Druscilla Brownie. Shela Nevin, P.A. CC:         Lemont Fillers, M.D.  [facility]  [address]  [address]  [place], [place] [address]   History and Physical  DATE OF BIRTH:  [birth year]  CHIEF COMPLAINT:  Recurrent dislocation, left total hip replacement.  HISTORY OF PRESENT ILLNESS:  This 77 year old white male underwent a ______ about two years ago.  He has had at least three dislocations of the left hip and, now, has to wear a hip abduction brace at all times.  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  He has been cleared preoperatively by Dr. Wallace Cullens of [facility] in [place].  He has donated no autologous blood for this procedure.  PAST MEDICAL HISTORY: 1. Right knee surgery in 1949. 2. Prostatectomy in 1989 for cancer. 3. Two herniorrhaphies in the past. 4. Hip fracture on the left in 1992. 5. Hip replacement in 1999. 6. History of jaundice in the past. 7. Pneumonia. 8. Shingles. 9. Inner ear problems.  FAMILY PHYSICIAN:  Wallace Cullens, M.D.  CURRENT MEDICATIONS: 1. Various vitamins including E and B-12. 2. Norflex tablets. 3. Clindamycin for dental work. 4. One baby aspirin per day, which he will stop five days prior to surgery.  ALLERGIES:  PENICILLIN and TETANUS (HORSE SERUM).  SOCIAL HISTORY:  The patient is married.  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  FAMILY HISTORY:  Positive for colon cancer in the father.  REVIEW OF SYSTEMS:  CNS: No seizure disorder, paralysis, numbness or double vision.  RESPIRATORY: No  productive cough.  No hemoptysis.  No shortness of breath.  CARDIOVASCULAR: No chest pain.  No angina.  No orthopnea. GASTROINTESTINAL: No nausea, vomiting, melena or blood stools.  GENITOURINARY: No discharge, dysuria or hematuria.  MUSCULOSKELETAL: Primarily in the present illness with the left hip.  PHYSICAL EXAMINATION:  GENERAL:  Alert, cooperative and friendly 77 year old white male who is well groomed.  He is accompanied by his wife.  VITAL SIGNS:  Blood pressure 128/72, pulse 72, respirations 12.  HEENT:  Normocephalic.  PERRLA.  Oropharynx clear.  CHEST:  Clear to auscultation.  No rhonchi.  No rales.  HEART:  Regular rate and rhythm.  No murmurs are heard.  ABDOMEN:  Soft and nontender.  Liver and spleen not felt.  GENITALIA AND RECTAL:  Not done.  Not pertinent to the present illness.  EXTREMITIES:  The patients left hip is held in a hip abduction brace.  He is having no pain with the hip today.  ADMITTING DIAGNOSIS:  Recurrent dislocation, left total hip replacement arthroplasty.  PLAN:  The patient will undergo revision of the polyethylene liner of his left total hip replacement arthroplasty. DD:  07/05/00 TD:  07/06/00 Job: [id number]

## 2011-01-16 NOTE — Discharge Summary (Signed)
[facility]  Patient:    Jordan [name], Jordan [name]                      MRN: [id number] Adm. Date:  [id number] Disc. Date: 07/16/00 Attending:  Marlowe Kays Page Dictator:   Druscilla Brownie. Shela Nevin, P.[name]. 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  OPERATIONS:  On July 13, 2000, the patient underwent revision of polyethylene liner left total knee replacement arthroplasty.  Jenne Campus, P.[name].C., assisted.  CONSULTATIONS:  None.  HISTORY OF PRESENT ILLNESS:  The patient had original surgery in January 1999. He first had [name] traumatic dislocation about [name] year after that and has had two subsequent posterior dislocations.  It was felt that this was probably [name] problem with the polyethylene liner and soft tissue laxity, and it was felt that the patient would benefit with revision, and the above procedure was scheduled.  HOSPITAL COURSE:  The patient tolerated the surgical procedure quite well.  He remained stable throughout his hospitalization.  He had no difficulty voiding after Foley removed, and p.o. analgesics were ______ covering his pain.  The wound remained dry.  Neurovascular remained intact to the right lower extremity.  He was very familiar with care of the right hip, and minimal therapy was needed.  We also did not think he needed any home physical therapy or home health.  On the day of discharge he was afebrile, vital signs were stable.  Hemoglobin was at 10.4, hematocrit was 30.2, and it was felt that he could be maintained in his home environment.  Arrangements were made for discharge.  DISCHARGE  LABORATORY DATA:  Hematologically showed [name] CBC with differential which was essentially within normal limits other than some mild anemia on admission.  Hemoglobin was 12.7, hematocrit was 37.5.  Final hemoglobin was as mentioned above.  Routine chemistries were completely within normal limits. Urinalysis negative for urinary tract infection.  No EKG seen on this chart.  No chest x-ray seen on this chart.  CONDITION ON DISCHARGE:  Improved and stable.  DISPOSITION:  The patient is discharged to his home to the care of his family.  DISCHARGE MEDICATIONS: 1. Ascriptin q.d. 2. Robaxin 500 mg #30, 1 q.6h. p.r.n. muscle spasm. 3. Tylox #50, 1-2 q.4-6h. p.r.n. pain. 4. Trinsicon #60, 1 b.i.d.  FOLLOW-UP:  Return to the clinic two weeks after date of surgery.  DISCHARGE INSTRUCTIONS:  Use dry dressings p.r.n.  Follow with Dr. Lemont Fillers at [facility], [address], [address], [place], [place]  [address]. DD:  07/16/00 TD:  07/16/00 Job: [id number]

## 2011-01-16 NOTE — Op Note (Signed)
[facility]  Patient:    Jordan [name], Jordan [name]                      MRN: [id number] Proc. Date: 07/13/00 Adm. Date:  [id number] Disc. Date: [id number] Attending:  Benny Lennert                           Operative Report  PREOPERATIVE DIAGNOSIS:  Recurrent posterior dislocation of left total hip replacement.  POSTOPERATIVE DIAGNOSIS:  Recurrent posterior dislocation of left total hip replacement.  OPERATION:  Revision of polyethylene liner, left total hip replacement.  SURGEON:  Illene Labrador. [place], M.D.  ASSISTANTDruscilla Brownie. Shela Nevin, P.[name].  ANESTHESIA:  General.  JUSTIFICATION FOR PROCEDURE:  Original surgery was on September 13, 1997, almost three years ago.  He sustained [name] traumatic dislocation roughly [name] year later on September 24, 1998, and had had two subsequent posterior dislocations when his leg was in [name] compromising position.  Technically, the components looked nicely positioned with no evidence of loosening and it was felt that this was probably [name] problem with polyethylene and perhaps soft tissue laxity and that he might perhaps require [name] little lengthening of the femoral neck, depending on the circumstances at surgery.  The original components were 10 degree liner at [name] +5, 26 mm head.  DESCRIPTION OF PROCEDURE:  Prophylactic antibiotics, satisfactory general anesthesia, Foley catheter inserted, right lateral decubitus position with the left hip prepped with DuraPrep, draped in the sterile field, Ioban employed. We went through the old surgical incision.  The previous anatomic tissues were not well-demarcated so that there was no discrete fascia lata, and the external rotators and gluteus medius were also not poorly delineated, so basically I continued with my skin incision down through the fascia and dissected the musculature off the femur and greater trochanter, splitting the buttock muscles slightly posteriorly.  The hip capsule was  identified and opened, and the flaps tagged with #1 Ethibond.  First demonstrated [name] dislocation coming out posteriorly, as was felt to be the case during his reductions.  There was no grooving of the polyethylene liner.  There was minimal _______ to the leg on push/pull.  I removed the femoral head and then began freeing up soft tissues around the acetabulum, eventually removing the capsule flaps for exposure.  Also, fashioned an area of [name] soft tissue pocket at the anterior acetabulum.  I then placed [name] drill hole into the polyethylene liner followed by the cortical screw, and was able to leave her out the previous 10 degree liner.  We then went through [name] trial process, working on various combinations of liner and neck length.  Preoperatively, he was 3/8 of an inch longer on the left, so I really did not want to have to lengthen him anymore unless absolutely necessary.  [name] 20 degree liner with +5 neck gave him excellent stability.  We then used [name] 20 degree eccentric liner which allowed for some 2 mm loss of neck length without having to compromise stability, and he seemed extremely stable with the trial components in place using [name] +0 C-tapered head.  Thus, in this fashion going from 5 mm C-tapered head to 0, we were able to shorten up the neck, and also gain improved stability.  I elected to go ahead with this combination.  The final polyethylene cup was placed with [name] ______ line at about 2 oclock.  It was impacted,  and checked, and found to be nicely secured.  I then went ahead with the final +0 C-tapered head which was impacted and the hip reduced, and again found to be stable in all directions.  We could not dislocate it on the table.  The wound was irrigated with antibiotic solution and closure performed.  I tightened up the external rotators and ________ abductor and piriformis muscles as [name] unit to try and achieve [name] little more soft tissue stability. Thus, we then tightened up the  fascia lata, all this with #1 Vicryl.  The subcutaneous tissue was closed with [name] combination of #1, 0, and 2-0 Vicryl, and skin staples.  Betadine Adaptic dry sterile dressing was applied.  He was gently placed on his back and given [name] knee immobilizer.  He was taken to the recovery room in satisfactory condition with no known complications.  EBL was perhaps 150 cc, no blood replacement. DD:  07/13/00 TD:  07/13/00 Job: [id number]

## 2011-01-16 NOTE — Op Note (Signed)
NAME:  Jordan [name], Jordan [name]                         ACCOUNT NO.:  [id number]   MEDICAL RECORD NO.:  [id number]                   PATIENT TYPE:  INP   LOCATION:  0002                                 FACILITY:  [facility]   PHYSICIAN:  Marlowe Kays, M.D.               DATE OF BIRTH:  [birth year]   DATE OF PROCEDURE:  07/04/2003  DATE OF DISCHARGE:                                 OPERATIVE REPORT   PREOPERATIVE DIAGNOSIS:  Recurrent dislocations, left total hip replacement.   POSTOPERATIVE DIAGNOSIS:  Recurrent dislocations, left total hip  replacement.   OPERATION/PROCEDURE:  Conversion of left total hip to constrained modality.   SURGEON:  Marlowe Kays, M.D.   ASSISTANTDruscilla Brownie. Underwood, P.[name].-C.   ANESTHESIA:  General.   PATHOLOGY AND JUSTIFICATION FOR PROCEDURE:  He had revision of the hip for  dislocation back in 2001 and has had several dislocations since.  He is [name]  Visual merchandiser and very active and it was felt that conversion to the constrained  modality was the treatment of choice at this point.  There is no evidence  for any disruption of the components.   DESCRIPTION OF PROCEDURE:  Prophylactic antibiotics, satisfactory general  anesthesia, right lateral decubitus position on the [place] II frame.  Left hip  was prepped with DuraPrep, draped into [name] sterile field.  I went through the  mid portion of the old surgical incision and detached the scarred external  rotators from the femur, finding the hip capsule.  Some of the capsule was  invaginated into the joint and also around the stem of the prosthesis.  Subtotal capsulectomy was performed initially and then I began maneuvering  the hip to complete the capsulectomy and the process freed up the femoral  head enough that I could remove anteriorly and medially.  I then removed the  polyethylene liner with cancellous screwdriver technique and then I spent [name]  good bit of time cleaning up the soft tissue and bone from around the  metal-  backed cup until it was clean enough that we would be able to put in the  constrained liner.  He had [name] 26 mm plus 0 head which was somewhat scuffed  and I elected to replace it.  After cleaning the trunnion, I irrigated the  wound well and inserted the constrained insert with scribe line at about 1  o'clock.  It was impacted tightly and there was no evidence for any lift-off  anywhere around the 360 degrees of the metal shell.  We then placed the new  plus 0, 26 mm head on the femoral component, impacted it and reduced the  hip, stabilizing it.  The hip was then taken through [name] full range of motion  with no evidence for any instability.  The wound was irrigated.  The wound  was irrigated antibiotic solution and closed with in layers with interrupted  #1 Vicryl in the  external rotators and fascia lata and deep subcutaneous  tissue, 2-0 Vicryl  superficial subcutaneous tissue and staples for the  skin.  Dry sterile dressing was applied.  He was placed on his PACU bed and  taken to the recovery room in satisfactory condition with minimal  complications.  Estimated blood loss was 100-200 mL.  No blood replacement.                                              Marlowe Kays, M.D.   JA/MEDQ  D:  07/04/2003  T:  07/04/2003  Job:  [id number]

## 2011-01-16 NOTE — H&P (Signed)
NAME:  Jordan SR., Jordan [name]                     ACCOUNT NO.:  [id number]   MEDICAL RECORD NO.:  [id number]                   PATIENT TYPE:  INP   LOCATION:  NA                                   FACILITY:  [facility]   PHYSICIAN:  Marlowe Kays, M.D.               DATE OF BIRTH:  [birth year]   DATE OF ADMISSION:  07/04/2003  DATE OF DISCHARGE:                                HISTORY & PHYSICAL   CHIEF COMPLAINT:  Problems with my left hip.   PRESENT ILLNESS:  This is [name] 77 year old white male who previously had [name]  total hip replacement arthroplasty to the left hip with [name] subsequent  revision secondary to three dislocations.  Unfortunately after that revision  in November 2001 the patient has had two more dislocations of left hip.  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   The patient has been cleared preoperatively by Dr. Wallace Cullens of [facility].   PAST MEDICAL HISTORY:  This gentleman has been in excellent health  throughout his lifetime, he has had prostatic problems in the past with  cancer and [name] prostatectomy in 1987.  He has had two inguinal herniorrhaphies  [name] year apart in the past, fracture of his hip in 1992, he had [name] total hip  replacement in 2000 with the revision in 2001.  Overall he is in excellent  medical health.  He does have pernicious anemia.  He gets [name] B12 shot  monthly.   CURRENT MEDICATIONS:  Multivitamins, vitamin E 400 units once daily, one  baby aspirin once daily (will stop 5 days prior to surgery), and vitamin B12  shot 1 mL monthly as mentioned above.   ALLERGIES:  PENICILLIN.   PHYSICIAN:  His family physician is Dr. Lemont Fillers of [facility].   FAMILY HISTORY:   Positive for colon cancer in the father.   SOCIAL HISTORY:  He is married; he is [name] Engineer, maintenance; has no intake of  tobacco or alcohol products; has three children, they and his wife will be [name]  major caregiver after surgery.  He lives in [name] two-story house 13 stairs.   REVIEW OF SYSTEMS:  CNS:  No seizure disorder or problems with numbness or  double vision.  RESPIRATORY:  No productive cough, no hemoptysis, no  shortness of breath.  CARDIOVASCULAR:  No chest pain, no angina or  orthopnea.  GASTROINTESTINAL:  No nausea, vomiting, melena, or bloody  stools.  GENITOURINARY:  The patient does have some mild infrequent minimal  incontinency secondary to prostatectomy in the past but no dysuria or  hematuria.  MUSCULOSKELETAL:  Primarily in present illness his left hip.  PHYSICAL EXAMINATION:  GENERAL:  Alert and cooperative, friendly, full alert  and oriented 77 year old white male who is accompanied by his wife.  VITAL SIGNS:  Blood pressure 128/68, pulse 76 and regular.  HEENT:  Normocephalic.  PERRLA.  EOM intact.  Oropharynx is clear.  CHEST:  Clear to auscultation; no rhonchi, no rales.  HEART:  Regular rate and rhythm; no murmurs are heard.  ABDOMEN:  Soft, nontender, liver/spleen not felt.  GENITALIA/RECTAL:  Not done, not pertinent to present illness.  EXTREMITIES:  The patient has an abduction brace on his left hip; no range  of motion is attempted.   ADMITTING DIAGNOSES:  1. Recurrent dislocation and revision of left total hip replacement     arthroplasty.  2. History of prostatic cancer.  3. Pernicious anemia.   PLAN:  The patient will undergo revision to [name] constrained hip of left total  hip replacement arthroplasty.  In all probability he will not need an  inpatient rehabilitation program and probably will not need home health.  We  will decide on his level of activity postoperatively if home health with  physical therapy might be indicated.  As the patient in all  probability  would be on Coumadin protocol postoperatively anywhere from 4-6 weeks we  will certainly ask Dr. Wallace Cullens of [facility] to help  [place] with his Coumadin protocol.     Dooley L. Cherlynn June.                 Marlowe Kays, M.D.    DLU/MEDQ  D:  06/26/2003  T:  06/26/2003  Job:  [id number]   cc:   Wallace Cullens  [address] [place].  [place]  [place] [address]  Fax: [phone]

## 2011-01-16 NOTE — Discharge Summary (Signed)
NAME:  Jordan [name], Jordan [name]                         ACCOUNT NO.:  [id number]   MEDICAL RECORD NO.:  [id number]                   PATIENT TYPE:  INP   LOCATION:  0481                                 FACILITY:  [facility]   PHYSICIAN:  Marlowe Kays, M.D.               DATE OF BIRTH:  06-05-1924   DATE OF ADMISSION:  07/04/2003  DATE OF DISCHARGE:  07/06/2003                                 DISCHARGE SUMMARY   ADMISSION DIAGNOSES:  1. Recurrent dislocation of revision of left total hip replacement     arthroplasty.  2. History of prostatic carcinoma.  3. Pernicious anemia.   DISCHARGE DIAGNOSES:  1. Recurrent dislocation of revision of left total hip replacement     arthroplasty.  2. History of prostatic carcinoma.  3. Pernicious anemia.   OPERATION:  On July 04, 2003, the patient underwent conversion of left  total hip replacement arthroplasty to constrained acetabular component.  D.L. Underwood assisted.   BRIEF HISTORY:  This 77 year old white male had previously undergone [name] total  hip replacement arthroplasty and [name] subsequent revision secondary to multiple  dislocations.  He has had two dislocations since his last revision.  After  much discussion, it was decided he would benefit with [name] constrained  acetabular component to the hip.  He is an extremely active gentleman and he  would like to remain so, so the above procedure was scheduled.   HOSPITAL COURSE:  The patient tolerated the surgical procedure quite well.  He was very aware of his total hip protocol.  He was ambulating in the hall  afebrile.  Hematocrit and hematocrit were stable.  Wound was dry at  discharge.  He was anxious to go home, and in particularly in light of the  fact that he did have [name] wife who certainly knew how to care for him due to  previous experiences.  He could be maintained in his home environment, and  arrangements were made for discharge.   LABORATORY DATA:  Hematologically, showed [name] CBC  preoperatively completely  within normal limits.  The hematocrit was 38.8.  Final hemoglobin was 11.8  with [name] hematocrit of 34.1.  Blood chemistries were completely within normal  limits.  Urinalysis negative for urinary tract infection.  The Gram smear  from the cultures taken at surgery showed no organisms.  Rare WBC was seen,  predominately mononuclear.  Chest x-ray showed no evidence of active disease  in the chest.  Electrocardiogram was normal sinus rhythm.   CONDITION ON DISCHARGE:  Improved and stable.   PLAN:  The patient is discharged to his home in the care of his wife.  He  will not need home physical therapy.  He may weight bear as tolerated on the  right lower extremity.  Continue home medications and diet.  Follow up with  his family physician per his instructions.  He is to return to the center  two weeks after the date of surgery.  He may shower Saturday evening or  Sunday.  Use dry dressing p.r.n.    DISCHARGE MEDICATIONS:  1. Vicodin #50 with one refill one to two p.o. q.4-6h. p.r.n. pain.  2. Robaxin 500 mg #30 with two refills one q.6h. p.r.n. muscle spasm.   He is to follow up with Dr. Omer Jack per his instructions.  Call if any  problems.     Dooley L. Cherlynn June.                 Marlowe Kays, M.D.    DLU/MEDQ  D:  07/06/2003  T:  07/06/2003  Job:  [id number]   cc:   Wallace Cullens  [address] [place].  [place]  [place] [address]  Fax: [phone]

## 2011-01-16 NOTE — Op Note (Signed)
NAME:  Jordan [name], Jordan [name]                         ACCOUNT NO.:  [id number]   MEDICAL RECORD NO.:  [id number]                   PATIENT TYPE:  EMS   LOCATION:  ED                                   FACILITY:  [facility]   PHYSICIAN:  Vania Rea. Supple, M.D.               DATE OF BIRTH:  Dec 18, 1923   DATE OF PROCEDURE:  05/29/2003  DATE OF DISCHARGE:                                 OPERATIVE REPORT   PREOPERATIVE DIAGNOSIS:  Recurrent dislocation of left total hip  arthroplasty.   POSTOPERATIVE DIAGNOSIS:  Recurrent dislocation of left total hip  arthroplasty.   OPERATION/PROCEDURE:  Closed reduction of dissipated left total hip  arthroplasty.   SURGEON:  Vania Rea. Supple, M.D.   ANESTHESIA:  General endotracheal anesthesia.   HISTORY:  [name] is [name] 77 year old gentleman who is status post left  total hip arthroplasty and has had [name] total of five dislocations.  He had [name]  revision of the hip arthroplasty approximately three years ago and has had  one subsequent dislocation approximately one year ago since the revision.  He was at home earlier today and bent over and twisted awkwardly and felt  his left hip give away and had [name] sensation of instability, pain and  inability to bear weight.  On evaluation in the emergency room, he was found  to have [name] foreshortened and internally rotated left lower extremity.  Radiographs confirmed [name] posterior superior dislocation of the left hip.  He  is brought to the operating room at this time for [name] planned closed reduction  of the left total hip arthroplasty.   Preoperatively, I counseled [name] on treatment options as well as risks  versus benefits and operative possible surgical complications of bleeding,  infection, neurovascular injury, persistence of pain, recurrence of  instability, possible need for revision surgery were reviewed.  He  understands and accepts and agrees with the planned procedure.   DESCRIPTION OF PROCEDURE:  After  undergoing routine preoperative evaluation,  the patient was brought to the operating room and placed supine on the  operating table.  He underwent smooth induction of [name] general endotracheal  anesthesia.  With appropriate relaxation, reduction maneuver was performed  on the left hip with [name] gentle flexion, adduction and internal rotation.  Longitudinal traction was then applied.  Reduction was then visible on  palpable.  Hip was taken through [name] range of motion and found to be stable.  Leg lengths were clinically equal.  The hip was then viewed fluoroscopically  and found to be concentrically reduced.  Under light fluoroscopy, the hip  was taken through [name] range of motion and showing stability.  The patient was  then placed into [name] knee immobilizer, extubated and taken to the recovery  room in stable condition.  Vania Rea. Supple, M.D.    KMS/MEDQ  D:  05/29/2003  T:  05/29/2003  Job:  [id number]

## 2011-01-16 NOTE — Op Note (Signed)
[facility]  Patient:    Jordan [name], Jordan [name]                        MRN: [id number] Proc. Date: 05/15/00 Attending:  R. Valma Cava, M.D.                           Operative Report  HISTORY OF PRESENT ILLNESS:  Jordan [name] is a 77 year old male well known to Dr. Simonne Come and a patient of his and my group. He is status post left total hip arthroplasty approximately 2 years ago. Since that time, he has had 2 previous dislocations prior today. The last time he dislocated his left total hip replacement in March of this year and was reduced by Dr. Simonne Come.  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 He was seen in the [facility] Emergency Room. X-ray noted posterior hip dislocation and I was consulted.  I saw the patient in the emergency room where he was accompanied by his wife. He was awake and alert complaining of left hip pain. His left hip was flexed, slightly internally rotated. Good pulse in the foot and ankle. He could dorsiflex and plantarflex well. X-rays showed a posterior dislocation and the total hip arthroplasty.  I discussed treatment options with the patient and his wife in detail and recommended a closed reduction under anesthesia. They agreed and wished to proceed. They understand the possibility of fracture, recurrent dislocation and etc. and wish to proceed.  PREOPERATIVE DIAGNOSES:  Left total hip arthroplasty posterior dislocation.  POSTOPERATIVE DIAGNOSES:  Left total hip arthroplasty posterior dislocation.  PROCEDURE:  Closed reduction of left dislocated total hip arthroplasty with examination under anesthesia.  SURGEON:  Valma Cava, M.D.  ANESTHESIA:  General.  COMPLICATIONS:  None.  DISPOSITION:  To PACU stable.  DESCRIPTION OF PROCEDURE:  The patient was counseled in the holding area. He was then taken to the  operating room and placed in supine position under general endotracheal anesthesia. The left hip was then gently reduced with longitudinal traction, flexion with gentle internal and external rotation with a palpable reduction. Leg lengths were symmetric after this. His range of motion he was stable to 90 degrees of flexion, 30 degrees of external rotation and 20 degrees of internal rotation. Abduction was also stable to 40 degrees.  Pulses remained stable. Plain x-rays were obtained which revealed anatomic reduction of the dislocation and no complications or implant problems. He was then held reduced, awakened and taken from the operating room to the PACU in stable condition. In the PACU, his brace will be applied before he is discharged to home.  PLAN:  Stabilization in the PACU. When he is awake and alert, I will put his brace on, allow him to ambulate and when he is stable he can go home from the emergency room.  Weightbearing as tolerated. Keep his brace on at all times. Follow-up in the office in a few days.  DISCHARGE MEDICATIONS:  Darvocet-N 100 and 1 enteric coated aspirin a day. DD:  05/15/00 TD:  05/17/00 Job: [id number]

## 2011-01-16 NOTE — Op Note (Signed)
[facility]  Patient:    [name], Jordan A Visit Number: [id number] MRN: [id number]          Service Type: EMS Location: ED Attending Physician:  Osvaldo Human Dictated by:   Ollen Gross, M.D. Proc. Date: 03/01/02 Admit Date:  02/28/2002 Discharge Date: 02/28/2002                             Operative Report  PREOPERATIVE DIAGNOSIS:  Dislocated left total hip arthroplasty.  POSTOPERATIVE DIAGNOSIS:  Dislocated left total hip arthroplasty.  OPERATION:  Closed reduction of left hip.  SURGEON:  Ollen Gross, M.D.  ASSISTANT:  Alexzandrew L. Perkins, P.A.-C.  ANESTHESIA:  General  COMPLICATIONS:  None.  CONDITION:  Stable to recovery room.  CLINICAL HISTORY:  Mr. [name] is a 77 year old male who has had a revision hip arthroplasty in November 2001 secondary to instability.  He did fine up until today.  He got his left foot stuck under a step and twisted and felt an immediate pop and dislocated the hip.  He was taken to the ER where he was noted to have an anterior dislocation. Neurovascular function was intact.  He presents now to the operating room for closed reduction.  DESCRIPTION OF PROCEDURE:  After successful administration of general anesthetic longitudinal traction and internal rotation were applied to the left lower extremity and an audible pop is felt.  He then had normal motion of the hip and confirmed to be a concentric reduction.  He subsequently was transported to recovery in stable condition. Dictated by:   Ollen Gross, M.D. Attending Physician:  Osvaldo Human DD:  02/28/02 TD:  03/03/02 Job: [id number]

## 2011-02-13 ENCOUNTER — Other Ambulatory Visit (INDEPENDENT_AMBULATORY_CARE_PROVIDER_SITE_OTHER): Payer: Self-pay | Admitting: General Surgery

## 2011-02-16 LAB — CULTURE, ROUTINE-ABSCESS
Gram Stain: NONE SEEN
Gram Stain: NONE SEEN

## 2011-03-11 ENCOUNTER — Encounter (INDEPENDENT_AMBULATORY_CARE_PROVIDER_SITE_OTHER): Payer: Self-pay | Admitting: General Surgery

## 2011-03-11 ENCOUNTER — Ambulatory Visit (INDEPENDENT_AMBULATORY_CARE_PROVIDER_SITE_OTHER): Payer: Medicare Other | Admitting: General Surgery

## 2011-03-11 DIAGNOSIS — K3532 Acute appendicitis with perforation and localized peritonitis, without abscess: Secondary | ICD-10-CM

## 2011-03-11 DIAGNOSIS — K352 Acute appendicitis with generalized peritonitis, without abscess: Secondary | ICD-10-CM

## 2011-03-11 NOTE — Progress Notes (Signed)
Jordan [name] returns he is now approximately 2 months following a ruptured appendicitis did Dr. Magnus Ivan with Dr. Magnus Ivan had injured his wrist Jordan [name] is incision was left open and the top part of the incision was packed by the visiting nurses for several weeks and I saw back proximal to go an incision was healed he had a small epidermoid cyst on his back that was infected that I expressed and cultured the fluid it grew an enterococcus but I did not place him on antibiotics except that he put Neosporin on the area the incision from his abdominal appendectomy is well healed with no weakness and this little area where the infected cyst was shows no evidence of any inflammation or nodule present at this time.  I instructed that if the patient's son and wife to watch the little incisions on the back and if there was any evidence of inflammation or a cyst recur and will happy to see him in the office. 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

## 2011-03-11 NOTE — Patient Instructions (Signed)
No restrictions an activity or diet. If cyst on back reoccurs return to see me

## 2014-02-08 ENCOUNTER — Ambulatory Visit (HOSPITAL_COMMUNITY)
Admission: EM | Admit: 2014-02-08 | Discharge: 2014-02-09 | Disposition: A | Discharge status: Home / Self Care | Payer: Medicare Other | Attending: Emergency Medicine | Admitting: Emergency Medicine

## 2014-02-08 ENCOUNTER — Encounter (HOSPITAL_COMMUNITY): Payer: Medicare Other | Admitting: Certified Registered Nurse Anesthetist

## 2014-02-08 ENCOUNTER — Emergency Department (HOSPITAL_COMMUNITY): Payer: Medicare Other

## 2014-02-08 ENCOUNTER — Encounter (HOSPITAL_COMMUNITY): Payer: Self-pay | Admitting: Emergency Medicine

## 2014-02-08 ENCOUNTER — Encounter (HOSPITAL_COMMUNITY)
Admission: EM | Disposition: A | Discharge status: Home / Self Care | Payer: Self-pay | Source: Home / Self Care | Attending: Emergency Medicine

## 2014-02-08 ENCOUNTER — Emergency Department (HOSPITAL_COMMUNITY): Payer: Medicare Other | Admitting: Certified Registered Nurse Anesthetist

## 2014-02-08 DIAGNOSIS — Z7982 Long term (current) use of aspirin: Secondary | ICD-10-CM | POA: Diagnosis not present

## 2014-02-08 DIAGNOSIS — Z8546 Personal history of malignant neoplasm of prostate: Secondary | ICD-10-CM | POA: Insufficient documentation

## 2014-02-08 DIAGNOSIS — T84029A Dislocation of unspecified internal joint prosthesis, initial encounter: Secondary | ICD-10-CM | POA: Insufficient documentation

## 2014-02-08 DIAGNOSIS — W07XXXA Fall from chair, initial encounter: Secondary | ICD-10-CM | POA: Insufficient documentation

## 2014-02-08 DIAGNOSIS — Z96649 Presence of unspecified artificial hip joint: Secondary | ICD-10-CM | POA: Insufficient documentation

## 2014-02-08 DIAGNOSIS — T84028A Dislocation of other internal joint prosthesis, initial encounter: Secondary | ICD-10-CM

## 2014-02-08 DIAGNOSIS — S73005A Unspecified dislocation of left hip, initial encounter: Secondary | ICD-10-CM

## 2014-02-08 HISTORY — DX: Malignant neoplasm of prostate: C61

## 2014-02-08 HISTORY — PX: HIP CLOSED REDUCTION: SHX983

## 2014-02-08 HISTORY — DX: Disorder of thyroid, unspecified: E07.9

## 2014-02-08 LAB — CBC
HEMATOCRIT: 36.8 % — AB (ref 39.0–52.0)
Hemoglobin: 12.4 g/dL — ABNORMAL LOW (ref 13.0–17.0)
MCH: 30.6 pg (ref 26.0–34.0)
MCHC: 33.7 g/dL (ref 30.0–36.0)
MCV: 90.9 fL (ref 78.0–100.0)
Platelets: 333 10*3/uL (ref 150–400)
RBC: 4.05 MIL/uL — AB (ref 4.22–5.81)
RDW: 14.5 % (ref 11.5–15.5)
WBC: 12.6 10*3/uL — AB (ref 4.0–10.5)

## 2014-02-08 LAB — CREATININE, SERUM
Creatinine, Ser: 0.78 mg/dL (ref 0.50–1.35)
GFR, EST NON AFRICAN AMERICAN: 79 mL/min — AB (ref >90)

## 2014-02-08 SURGERY — CLOSED REDUCTION, HIP
Anesthesia: General | Laterality: Left

## 2014-02-08 MED ORDER — ONDANSETRON HCL 4 MG PO TABS: 4.0000 mg | ORAL_TABLET | Freq: Four times a day (QID) | ORAL | Status: DC | PRN

## 2014-02-08 MED ORDER — SUCCINYLCHOLINE CHLORIDE 20 MG/ML IJ SOLN: INTRAMUSCULAR | Status: DC | PRN

## 2014-02-08 MED ORDER — SUCCINYLCHOLINE CHLORIDE 20 MG/ML IJ SOLN
INTRAMUSCULAR | Status: DC | PRN
  Administered 2014-02-08: 20 mg via INTRAVENOUS
  Administered 2014-02-08: 100 mg via INTRAVENOUS

## 2014-02-08 MED ORDER — METOCLOPRAMIDE HCL 10 MG PO TABS: 5.0000 mg | ORAL_TABLET | Freq: Three times a day (TID) | ORAL | Status: DC | PRN

## 2014-02-08 MED ORDER — PROPOFOL 10 MG/ML IV BOLUS
INTRAVENOUS | Status: AC
  Filled 2014-02-08: qty 20

## 2014-02-08 MED ORDER — FENTANYL CITRATE 0.05 MG/ML IJ SOLN: 25.0000 ug | INTRAMUSCULAR | Status: DC | PRN

## 2014-02-08 MED ORDER — LIDOCAINE HCL (CARDIAC) 20 MG/ML IV SOLN
INTRAVENOUS | Status: DC | PRN
  Administered 2014-02-08: 50 mg via INTRAVENOUS

## 2014-02-08 MED ORDER — MORPHINE SULFATE 2 MG/ML IJ SOLN: 1.0000 mg | INTRAMUSCULAR | Status: DC | PRN

## 2014-02-08 MED ORDER — PROPOFOL 10 MG/ML IV BOLUS
INTRAVENOUS | Status: DC | PRN
  Administered 2014-02-08: 100 mg via INTRAVENOUS

## 2014-02-08 MED ORDER — FENTANYL CITRATE 0.05 MG/ML IJ SOLN
INTRAMUSCULAR | Status: AC
  Filled 2014-02-08: qty 2

## 2014-02-08 MED ORDER — DEXAMETHASONE SODIUM PHOSPHATE 10 MG/ML IJ SOLN
INTRAMUSCULAR | Status: DC | PRN
  Administered 2014-02-08: 10 mg via INTRAVENOUS

## 2014-02-08 MED ORDER — ONDANSETRON HCL 4 MG/2ML IJ SOLN
INTRAMUSCULAR | Status: AC
  Filled 2014-02-08: qty 2

## 2014-02-08 MED ORDER — LACTATED RINGERS IV SOLN
INTRAVENOUS | Status: DC
Start: 2014-02-08 — End: 2014-02-08
  Administered 2014-02-08: 14:00:00 via INTRAVENOUS

## 2014-02-08 MED ORDER — PROPOFOL 10 MG/ML IV BOLUS
INTRAVENOUS | Status: AC | PRN
  Administered 2014-02-08: 40 mg via INTRAVENOUS

## 2014-02-08 MED ORDER — METOCLOPRAMIDE HCL 5 MG/ML IJ SOLN: 5.0000 mg | Freq: Three times a day (TID) | INTRAMUSCULAR | Status: DC | PRN

## 2014-02-08 MED ORDER — LIDOCAINE HCL (CARDIAC) 20 MG/ML IV SOLN
INTRAVENOUS | Status: AC
  Filled 2014-02-08: qty 5

## 2014-02-08 MED ORDER — LEVOTHYROXINE SODIUM 50 MCG PO TABS
50.0000 ug | ORAL_TABLET | Freq: Every day | ORAL | Status: DC
  Administered 2014-02-09: 50 ug via ORAL
  Filled 2014-02-08 (×2): qty 1

## 2014-02-08 MED ORDER — PROPOFOL 10 MG/ML IV BOLUS
0.5000 mg/kg | Freq: Once | INTRAVENOUS | Status: AC
  Administered 2014-02-08: 39 mg via INTRAVENOUS
  Filled 2014-02-08: qty 1

## 2014-02-08 MED ORDER — ASPIRIN EC 81 MG PO TBEC
81.0000 mg | DELAYED_RELEASE_TABLET | Freq: Every evening | ORAL | Status: DC
  Filled 2014-02-08 (×2): qty 1

## 2014-02-08 MED ORDER — ENOXAPARIN SODIUM 30 MG/0.3ML ~~LOC~~ SOLN
30.0000 mg | SUBCUTANEOUS | Status: DC
  Administered 2014-02-09: 30 mg via SUBCUTANEOUS
  Filled 2014-02-08 (×2): qty 0.3

## 2014-02-08 MED ORDER — SODIUM CHLORIDE 0.9 % IV SOLN
INTRAVENOUS | Status: AC | PRN
  Administered 2014-02-08: 100 mL/h via INTRAVENOUS

## 2014-02-08 MED ORDER — ONDANSETRON HCL 4 MG/2ML IJ SOLN: 4.0000 mg | Freq: Four times a day (QID) | INTRAMUSCULAR | Status: DC | PRN

## 2014-02-08 MED ORDER — HYDROCODONE-ACETAMINOPHEN 5-325 MG PO TABS: 1.0000 | ORAL_TABLET | ORAL | Status: DC | PRN

## 2014-02-08 MED ORDER — LIDOCAINE HCL (CARDIAC) 20 MG/ML IV SOLN: INTRAVENOUS | Status: DC | PRN

## 2014-02-08 MED ORDER — ONDANSETRON HCL 4 MG/2ML IJ SOLN
INTRAMUSCULAR | Status: DC | PRN
  Administered 2014-02-08 (×2): 2 mg via INTRAVENOUS

## 2014-02-08 MED ORDER — PROPOFOL 10 MG/ML IV EMUL
INTRAVENOUS | Status: AC | PRN
  Administered 2014-02-08: 4 mL via INTRAVENOUS

## 2014-02-08 MED ORDER — FENTANYL CITRATE 0.05 MG/ML IJ SOLN
INTRAMUSCULAR | Status: DC | PRN
  Administered 2014-02-08: 50 ug via INTRAVENOUS

## 2014-02-08 MED ORDER — LACTATED RINGERS IV SOLN
INTRAVENOUS | Status: DC | PRN
  Administered 2014-02-08: 13:00:00 via INTRAVENOUS

## 2014-02-08 MED ORDER — KCL IN DEXTROSE-NACL 20-5-0.9 MEQ/L-%-% IV SOLN
INTRAVENOUS | Status: DC
  Administered 2014-02-08: 18:00:00 via INTRAVENOUS
  Filled 2014-02-08 (×2): qty 1000

## 2014-02-08 SURGICAL SUPPLY — 18 items
BANDAGE ADH SHEER 1  50/CT (GAUZE/BANDAGES/DRESSINGS) IMPLANT
CHLORAPREP W/TINT 26ML (MISCELLANEOUS) IMPLANT
CLOTH 2% CHLOROHEXIDINE 3PK (PERSONAL CARE ITEMS) IMPLANT
GAUZE SPONGE 4X4 12PLY STRL (GAUZE/BANDAGES/DRESSINGS) IMPLANT
GLOVE BIOGEL PI IND STRL 7.5 (GLOVE) IMPLANT
GLOVE BIOGEL PI IND STRL 8 (GLOVE) IMPLANT
GLOVE BIOGEL PI INDICATOR 7.5 (GLOVE)
GLOVE BIOGEL PI INDICATOR 8 (GLOVE)
GLOVE SURG SS PI 7.5 STRL IVOR (GLOVE) IMPLANT
GLOVE SURG SS PI 8.0 STRL IVOR (GLOVE) IMPLANT
GOWN STRL REUS W/TWL LRG LVL3 (GOWN DISPOSABLE) IMPLANT
GOWN STRL REUS W/TWL XL LVL3 (GOWN DISPOSABLE) IMPLANT
IMMOBILIZER KNEE 20 (SOFTGOODS) ×3 IMPLANT
MANIFOLD NEPTUNE II (INSTRUMENTS) IMPLANT
NDL SAFETY ECLIPSE 18X1.5 (NEEDLE) IMPLANT
NEEDLE HYPO 18GX1.5 SHARP (NEEDLE)
POSITIONER SURGICAL ARM (MISCELLANEOUS) IMPLANT
SYR CONTROL 10ML LL (SYRINGE) IMPLANT

## 2014-02-08 NOTE — Brief Op Note (Signed)
02/08/2014  1:52 PM  PATIENT:  Jordan [name]  80 y.o. male  PRE-OPERATIVE DIAGNOSIS:  dislocated left hip  POST-OPERATIVE DIAGNOSIS:  DISLOCATED LEFT HIP   PROCEDURE:  Procedure(s): CLOSED REDUCTION HIP (Left)  SURGEON:  Surgeon(s) and Role:    * Johnn Hai, MD - Primary  PHYSICIAN ASSISTANT:   ASSISTANTS: none   ANESTHESIA:   general  EBL:     BLOOD ADMINISTERED:none  DRAINS: none   LOCAL MEDICATIONS USED:  NONE  SPECIMEN:  No Specimen  DISPOSITION OF SPECIMEN:  N/A  COUNTS:  YES  TOURNIQUET:  * No tourniquets in log *  DICTATION: .Other Dictation: Dictation Number [id number]  PLAN OF CARE: Admit for overnight observation  PATIENT DISPOSITION:  PACU - hemodynamically stable.   Delay start of Pharmacological VTE agent (>24hrs) due to surgical blood loss or risk of bleeding: no

## 2014-02-08 NOTE — Transfer of Care (Signed)
Immediate Anesthesia Transfer of Care Note  Patient: Jordan [name]  Procedure(s) Performed: Procedure(s): CLOSED REDUCTION HIP (Left)  Patient Location: PACU  Anesthesia Type:General  Level of Consciousness: awake, confused, lethargic and responds to stimulation  Airway & Oxygen Therapy: Patient Spontanous Breathing and Patient connected to face mask oxygen  Post-op Assessment: Report given to PACU RN and Post -op Vital signs reviewed and stable  Post vital signs: Reviewed and stable  Complications: No apparent anesthesia complications

## 2014-02-08 NOTE — Anesthesia Procedure Notes (Signed)
Procedure Name: Intubation Date/Time: 02/08/2014 1:11 PM Performed by: Ofilia Neas Pre-anesthesia Checklist: Patient identified, Timeout performed, Emergency Drugs available, Suction available and Patient being monitored Patient Re-evaluated:Patient Re-evaluated prior to inductionOxygen Delivery Method: Circle system utilized Preoxygenation: Pre-oxygenation with 100% oxygen Intubation Type: IV induction, Cricoid Pressure applied and Rapid sequence Laryngoscope Size: Mac and 4 Grade View: Grade II Tube type: Oral Tube size: 7.5 mm Number of attempts: 1 Airway Equipment and Method: Stylet Placement Confirmation: ETT inserted through vocal cords under direct vision,  breath sounds checked- equal and bilateral and positive ETCO2 (cords vis with cricoid, head lift) Secured at: 21 cm Tube secured with: Tape Dental Injury: Teeth and Oropharynx as per pre-operative assessment

## 2014-02-08 NOTE — ED Notes (Signed)
Bed: ZJ67 Expected date:  Expected time:  Means of arrival:  Comments: EMS-dislocated hip

## 2014-02-08 NOTE — Anesthesia Postprocedure Evaluation (Signed)
  Anesthesia Post-op Note  Patient: Jordan [name]  Procedure(s) Performed: Procedure(s) (LRB): CLOSED REDUCTION HIP (Left)  Patient Location: PACU  Anesthesia Type: General  Level of Consciousness: awake and alert   Airway and Oxygen Therapy: Patient Spontanous Breathing  Post-op Pain: mild  Post-op Assessment: Post-op Vital signs reviewed, Patient's Cardiovascular Status Stable, Respiratory Function Stable, Patent Airway and No signs of Nausea or vomiting  Last Vitals:  Filed Vitals:   02/08/14 1430  BP: 136/69  Pulse: 67  Temp: 36.9 C  Resp: 14    Post-op Vital Signs: stable   Complications: No apparent anesthesia complications

## 2014-02-08 NOTE — ED Notes (Signed)
Per EMS- Patient had a left hip replacement 10 years ago. Today, patient was reaching for something and left hip dislocated which he says has happened before. Patient was given 2 doses of Morphine 4 mg IV and pain is currently 1/10.

## 2014-02-08 NOTE — ED Provider Notes (Addendum)
CSN: [id number]     Arrival date & time 02/08/14  0911 History   First MD Initiated Contact with Patient 02/08/14 0919     Chief Complaint  Patient presents with  . Hip Pain     (Consider location/radiation/quality/duration/timing/severity/associated sxs/prior Treatment) HPI Comments: Patient presents to ER for evaluation of left hip pain. Patient reports that he was sitting in a chair and bent over to pick something up, slid off the chair and landed on his backside. The patient felt a pop and severe pain in the hip. Patient has a history of dislocation of his prosthetic hip in the past and it feels similar. He did not hit his head or lose consciousness. Denies headache, neck pain, back pain.  Patient is a 80 y.o. male presenting with hip pain.  Hip Pain    Past Medical History  Diagnosis Date  . Cancer     prostate  . Prostate cancer   . Thyroid disease    Past Surgical History  Procedure Laterality Date  . Prostate surgery    . Appendectomy    . Total hip arthroplasty     Family History  Problem Relation Age of Onset  . Cancer Father    History  Substance Use Topics  . Smoking status: Never Smoker   . Smokeless tobacco: Never Used  . Alcohol Use: No    Review of Systems  Musculoskeletal: Positive for arthralgias.  All other systems reviewed and are negative.     Allergies  Penicillins and Tetanus toxoids  Home Medications   Prior to Admission medications   Medication Sig Start Date End Date Taking? Authorizing Provider  betaxolol (KERLONE) 10 MG tablet daily.  02/12/11   Historical Provider, MD  Cyanocobalamin (VITAMIN B-12 CR PO) Take by mouth daily.      Historical Provider, MD  SYNTHROID 50 MCG tablet daily.  02/12/11   Historical Provider, MD   BP 118/48  Pulse 68  Temp(Src) 98.6 F (37 C) (Oral)  Resp 20  SpO2 95% Physical Exam  Constitutional: He is oriented to person, place, and time. He appears well-developed and well-nourished. No distress.   HENT:  Head: Normocephalic and atraumatic.  Right Ear: Hearing normal.  Left Ear: Hearing normal.  Nose: Nose normal.  Mouth/Throat: Oropharynx is clear and moist and mucous membranes are normal.  Eyes: Conjunctivae and EOM are normal. Pupils are equal, round, and reactive to light.  Neck: Normal range of motion. Neck supple.  Cardiovascular: Regular rhythm, S1 normal and S2 normal.  Exam reveals no gallop and no friction rub.   No murmur heard. Pulmonary/Chest: Effort normal and breath sounds normal. No respiratory distress. He exhibits no tenderness.  Abdominal: Soft. Normal appearance and bowel sounds are normal. There is no hepatosplenomegaly. There is no tenderness. There is no rebound, no guarding, no tenderness at McBurney's point and negative Murphy's sign. No hernia.  Musculoskeletal:       Left hip: He exhibits decreased range of motion, tenderness and deformity.  Neurological: He is alert and oriented to person, place, and time. He has normal strength. No cranial nerve deficit or sensory deficit. Coordination normal. GCS eye subscore is 4. GCS verbal subscore is 5. GCS motor subscore is 6.  Skin: Skin is warm, dry and intact. No rash noted. No cyanosis.  Psychiatric: He has a normal mood and affect. His speech is normal and behavior is normal. Thought content normal.    ED Course  Procedures (including critical care time)  Procedure: Conscious Sedation Patient was placed on suplemental oxygen as well as continuous cardiac and pulse oximetry monitoring. Patient was administered medications under direct supervision by myself. Excellent sedation was achieved. Patient's vital signs remained stable. The patient was continuously monitored during the recovery phase. The patient tolerated the sedation without complication. Total sedation time 30 minutes  Procedure: Left hip reduction Patient was placed in the supine position. Attempt at closed reduction of left hip dislocation was made  by performing traction along the femur. Traction was applied with very slight changes in the abduction and abduction of the hip. Multiple attempts were made but there was no success in reducing the hip. Patient did tolerate this procedure well without complication.  Labs Review Labs Reviewed - No data to display  Imaging Review Dg Hip Complete Left  02/08/2014   CLINICAL DATA:  Left hip pain  EXAM: LEFT HIP - COMPLETE 2+ VIEW  COMPARISON:  11/24/2011  FINDINGS: Posterior dislocation of the left hip prosthesis. Total hip replacement is noted. No fracture.  IMPRESSION: Left total hip replacement with posterior dislocation.   Electronically Signed   By: Franchot Gallo M.D.   On: 02/08/2014 09:55     EKG Interpretation None      MDM   Final diagnoses:  None   left hip prosthesis dislocation  X-ray confirms dislocation of left hip prosthesis. No evidence of concomitant fracture. There appears to only be isolated hip injury comments patient had a very short fall from a seated position, no other injury noted on exam. I recommended closed reduction with sedation. Consent was given and the procedure was attempted, but unsuccessful. Patient has significant spasm around the joint despite adequate sedation. Orthopedics was therefore consulted and will take the patient to the OR for closed reduction.    Orpah Greek, MD 02/09/14 [place], MD 02/16/14 1259

## 2014-02-08 NOTE — H&P (Signed)
Jordan [name] is an 80 y.o. male.   Chief Complaint: Left hip disclocation HPI: Bent over today felt hip pain  Past Medical History  Diagnosis Date  . Cancer     prostate  . Prostate cancer   . Thyroid disease     Past Surgical History  Procedure Laterality Date  . Prostate surgery    . Appendectomy    . Total hip arthroplasty      Family History  Problem Relation Age of Onset  . Cancer Father    Social History:  reports that he has never smoked. He has never used smokeless tobacco. He reports that he does not drink alcohol or use illicit drugs.  Allergies:  Allergies  Allergen Reactions  . Penicillins     unknown  . Tetanus Toxoids     unknown    Medications Prior to Admission  Medication Sig Dispense Refill  . aspirin EC 81 MG tablet Take 81 mg by mouth every evening.      . B Complex Vitamins (VITAMIN B COMPLEX IJ) Inject as directed every 30 (thirty) days.      Marland Kitchen SYNTHROID 50 MCG tablet Take 50 mcg by mouth daily before breakfast.       . vitamin E 400 UNIT capsule Take 400 Units by mouth daily.        No results found for this or any previous visit (from the past 48 hour(s)). Dg Hip Complete Left  02/08/2014   CLINICAL DATA:  Left hip pain  EXAM: LEFT HIP - COMPLETE 2+ VIEW  COMPARISON:  11/24/2011  FINDINGS: Posterior dislocation of the left hip prosthesis. Total hip replacement is noted. No fracture.  IMPRESSION: Left total hip replacement with posterior dislocation.   Electronically Signed   By: Franchot Gallo M.D.   On: 02/08/2014 09:55    Review of Systems  Musculoskeletal: Positive for joint pain.  All other systems reviewed and are negative.   Blood pressure 160/119, pulse 70, temperature 98.6 F (37 C), temperature source Oral, resp. rate 15, height 6' (1.829 m), weight 78.019 kg (172 lb), SpO2 100.00%. Physical Exam  Constitutional: He appears well-developed.  HENT:  Head: Normocephalic.  Eyes: Pupils are equal, round, and reactive to light.   Neck: Normal range of motion.  Cardiovascular: Normal rate.   Respiratory: Effort normal.  GI: Soft.  Musculoskeletal:  Left leg ER and shortened. NVI.  Neurological: He is alert.  Skin: Skin is warm and dry.  Psychiatric: He has a normal mood and affect.     Assessment/Plan Left Total hip dislocation. Plan reduction. Risks discussed.  Jalayna Josten C 02/08/2014, 12:56 PM

## 2014-02-08 NOTE — Anesthesia Preprocedure Evaluation (Addendum)
Anesthesia Evaluation  Patient identified by MRN, date of birth, ID band Patient awake    Reviewed: Allergy & Precautions, H&P , NPO status , Patient's Chart, lab work & pertinent test results  Airway Mallampati: II TM Distance: >3 FB Neck ROM: full    Dental no notable dental hx.    Pulmonary neg pulmonary ROS,  breath sounds clear to auscultation  Pulmonary exam normal       Cardiovascular Exercise Tolerance: Good negative cardio ROS  Rhythm:regular Rate:Normal     Neuro/Psych negative neurological ROS  negative psych ROS   GI/Hepatic negative GI ROS, Neg liver ROS,   Endo/Other  negative endocrine ROS  Renal/GU negative Renal ROS  negative genitourinary   Musculoskeletal   Abdominal   Peds  Hematology negative hematology ROS (+)   Anesthesia Other Findings   Reproductive/Obstetrics negative OB ROS                           Anesthesia Physical Anesthesia Plan  ASA: II  Anesthesia Plan: General   Post-op Pain Management:    Induction: Intravenous  Airway Management Planned: Oral ETT  Additional Equipment:   Intra-op Plan:   Post-operative Plan: Extubation in OR  Informed Consent: I have reviewed the patients History and Physical, chart, labs and discussed the procedure including the risks, benefits and alternatives for the proposed anesthesia with the patient or authorized representative who has indicated his/her understanding and acceptance.   Dental Advisory Given  Plan Discussed with: CRNA and Surgeon  Anesthesia Plan Comments:        Anesthesia Quick Evaluation

## 2014-02-09 ENCOUNTER — Encounter (HOSPITAL_COMMUNITY): Payer: Self-pay | Admitting: Specialist

## 2014-02-09 DIAGNOSIS — T84029A Dislocation of unspecified internal joint prosthesis, initial encounter: Secondary | ICD-10-CM | POA: Diagnosis not present

## 2014-02-09 MED ORDER — ASPIRIN EC 81 MG PO TBEC: 325.0000 mg | DELAYED_RELEASE_TABLET | Freq: Every evening | ORAL | Status: AC

## 2014-02-09 NOTE — Op Note (Signed)
NAME:  Jordan [name], Jordan [name]               ACCOUNT NO.:  [id number]  MEDICAL RECORD NO.:  [id number]  LOCATION:  2297                         FACILITY:  [facility]  PHYSICIAN:  Susa Day, M.D.    DATE OF BIRTH:  October 09, 1923  DATE OF PROCEDURE:  02/08/2014 DATE OF DISCHARGE:                              OPERATIVE REPORT   PREOPERATIVE DIAGNOSIS:  Dislocated left total hip replacement.  POSTOPERATIVE DIAGNOSIS:  Dislocated left total hip replacement.  PROCEDURE PERFORMED:  Closed reduction under anesthesia followed by stress radiographs.  ANESTHESIA:  General.  ASSISTANT:  No assistant.  BRIEF HISTORY:  This is an 80 year old who has had a history of hip replacement, dislocation, revision today, bent over to grab something off the floor, dislocated his hip, they were unsuccessful in the emergency room.  He is patient of Dr. Shellia Carwin.  I was asked to assist in his closed reduction.  Risks and benefits were discussed including inability to reduce, need for revision, etc.  TECHNIQUE:  With the patient in supine position, after the induction of adequate general anesthesia and under C-arm augmentation, we applied a gentle longitudinal traction and internal rotation to the extremity. With multiple attempts, the patient was closed reduction under x-ray, but was still in a subluxed or near dislocated position.  We tried multiple maneuvers and finally one successful was the flexion to 90 longitudinal traction and internal rotation with pelvic stabilization. Reduction was appreciated and leg lengths returned to equivalent, the appropriate external rotation, good pulses following that.  Radiographs indicated reduction of the hip and it was stable through flexion and extension, internal and external rotation.  Knee immobilizer was applied.  He was extubated and transported to the recovery room in a satisfactory condition.  The patient tolerated the procedure well.  No complications.   No assistant.     Susa Day, M.D.     Geralynn Rile  D:  02/08/2014  T:  02/09/2014  Job:  [id number]

## 2014-02-09 NOTE — Progress Notes (Signed)
Patient and daughter state that he has a hip abduction brace, and will use it, they are declining pt/ot for home.  D.Draden Cottingham RN

## 2014-02-09 NOTE — Evaluation (Signed)
Occupational Therapy Evaluation Patient Details Name: Jordan [name] MRN: [id number] DOB: [birth year] Today's Date: 02/09/2014    History of Present Illness s/ hip dislocation and closed reduction PMHx:  L THA (and revisions per pt with at least 6 dislocations)   Clinical Impression   This 80 year old man is s/p closed reduction for dislocation of L hip.  All education was completed.  Daughter present for part of session and will assist with socks as needed and check in.      Follow Up Recommendations  No OT follow up;Supervision - Intermittent    Equipment Recommendations  None recommended by OT (recommend using 3:1 over toilet and tub bench--has both)    Recommendations for Other Services       Precautions / Restrictions Precautions Precautions: Posterior Hip Precaution Comments: pt also has a hip abduction brace at home, discussed wearing this with pt, dtr adn RN rather than KI; pt prefers the hip abduction brace Required Braces or Orthoses: Knee Immobilizer - Left Restrictions Weight Bearing Restrictions: No Other Position/Activity Restrictions: WBAT      Mobility Bed Mobility Overal bed mobility: Needs Assistance Bed Mobility: Supine to Sit     Supine to sit: Supervision     General bed mobility comments: cues for THP  Transfers Overall transfer level: Needs assistance Equipment used: None Transfers: Sit to/from Stand Sit to Stand: Min guard         General transfer comment: cues for 90 degrees    Balance                                            ADL Overall ADL's : Needs assistance/impaired             Lower Body Bathing: Min guard;Sit to/from stand;With adaptive equipment       Lower Body Dressing: Minimal assistance;Sit to/from stand;With adaptive equipment   Toilet Transfer: Min guard;Ambulation;BSC             General ADL Comments: pt is able to complete UB adls with set up.  He needed min A with socks/sock aid  due to sock aid hitting KI.  Pt may use hip abductor brace at home which would clear brace. He states he needs a new sock aide.  Pt did have a LOB standing to manage overalls: he can use RW for increased stability. Recommended he put 3:1 commode over his comfort height toilet as it is a little short for him.      Vision                     Perception     Praxis      Pertinent Vitals/Pain No c/o pain.  Doesn't like KI--wants to don abduction brace when he gets home.      Hand Dominance     Extremity/Trunk Assessment Upper Extremity Assessment Upper Extremity Assessment: Overall WFL for tasks assessed          Communication Communication Communication: HOH   Cognition Arousal/Alertness: Awake/alert Behavior During Therapy: WFL for tasks assessed/performed Overall Cognitive Status: Within Functional Limits for tasks assessed                     General Comments       Exercises       Shoulder Instructions      Home  Living Family/patient expects to be discharged to:: Private residence Living Arrangements: Spouse/significant other Available Help at Discharge: Family Type of Home: House Home Access: Stairs to enter Technical brewer of Steps: 2   Home Layout: One level     Bathroom Shower/Tub: Tub/shower unit Shower/tub characteristics: Curtain Biochemist, clinical: Handicapped height     Home Equipment: Tub bench;Bedside commode   Additional Comments: comfort ht toilet;  pt wife has dementia but is fairly independent, pt doing majority of household duties, 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 [place]      Prior Functioning/Environment Level of Independence: Independent             OT Diagnosis:     OT Problem List:     OT Treatment/Interventions:      OT Goals(Current goals can be found in the care plan section) Acute Rehab OT Goals Patient Stated Goal: home  OT Frequency:     Barriers to D/C:            Co-evaluation               End of Session    Activity Tolerance: Patient tolerated treatment well Patient left: in chair;with call bell/phone within reach;with family/visitor present;with nursing/sitter in room   Time: 7619-5093 OT Time Calculation (min): 19 min Charges:  OT General Charges $OT Visit: 1 Procedure OT Evaluation $Initial OT Evaluation Tier I: 1 Procedure OT Treatments $Self Care/Home Management : 8-22 mins G-Codes: OT G-codes **NOT FOR INPATIENT CLASS** Functional Assessment Tool Used: clinical observation Functional Limitation: Self care Self Care Current Status (O6712): At least 20 percent but less than 40 percent impaired, limited or restricted Self Care Goal Status (W5809): At least 20 percent but less than 40 percent impaired, limited or restricted Self Care Discharge Status [phone]): At least 20 percent but less than 40 percent impaired, limited or restricted  [facility] 02/09/2014, 11:37 AM Lesle Chris, OTR/L [phone] 02/09/2014

## 2014-02-09 NOTE — Evaluation (Signed)
Physical Therapy Evaluation Patient Details Name: Jordan [name] MRN: [id number] DOB: [birth year] Today's Date: 02/09/2014   History of Present Illness  s/ hip dislocation and closed reduction PMHx:  L THA (and revisions per pt with at least 6 dislocations)  Clinical Impression  Pt  Doing well; would like to wear his hip abduction brace rather than the KI; we have reviewed THP and the handout; we have also discussed him sing his "walking cane" at home for a little extra support as he does not like the walker;    Follow Up Recommendations No PT follow up    Equipment Recommendations  None recommended by PT    Recommendations for Other Services       Precautions / Restrictions Precautions Precautions: Posterior Hip Precaution Comments: pt also has a hip abduction brace at home, discussed wearing this with pt, dtr adn RN rather than KI; pt prefers the hip abduction brace Required Braces or Orthoses: Knee Immobilizer - Left Restrictions Other Position/Activity Restrictions: WBAT      Mobility  Bed Mobility Overal bed mobility: Needs Assistance Bed Mobility: Supine to Sit     Supine to sit: Supervision     General bed mobility comments: cues for THP  Transfers Overall transfer level: Needs assistance Equipment used: Rolling walker (2 wheeled);None Transfers: Sit to/from Stand Sit to Stand: Supervision         General transfer comment: cues for THP  Ambulation/Gait Ambulation/Gait assistance: Min guard;Supervision Ambulation Distance (Feet): 360 Feet Assistive device: Rolling walker (2 wheeled);None Gait Pattern/deviations: Step-to pattern;Step-through pattern;Trunk flexed     General Gait Details: L hip hike with slight circumduction d/t KI;   Stairs            Wheelchair Mobility    Modified Rankin (Stroke Patients Only)       Balance                                             Pertinent Vitals/Pain No c/o pain    Home  Living Family/patient expects to be discharged to:: Private residence Living Arrangements: Spouse/significant other Available Help at Discharge: Family Type of Home: House Home Access: Stairs to enter   Technical brewer of Steps: 2 Home Layout: One level Home Equipment: Environmental consultant - 2 wheels;Bedside commode Additional Comments: comfort ht toilet;  pt wife has dementia but is fairly independent, pt doing majority of household duties, 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 [place]    Prior Function Level of Independence: Independent               Journalist, newspaper        Extremity/Trunk Assessment   Upper Extremity Assessment: Defer to OT evaluation           Lower Extremity Assessment: LLE deficits/detail         Communication   Communication: No difficulties  Cognition Arousal/Alertness: Awake/alert Behavior During Therapy: WFL for tasks assessed/performed Overall Cognitive Status: Within Functional Limits for tasks assessed                      General Comments      Exercises        Assessment/Plan    PT Assessment Patent does not need any further PT services  PT Diagnosis     PT Problem List  PT Treatment Interventions     PT Goals (Current goals can be found in the Care Plan section) Acute Rehab PT Goals Patient Stated Goal: home    Frequency     Barriers to discharge        Co-evaluation               End of Session Equipment Utilized During Treatment: Gait belt Activity Tolerance: Patient tolerated treatment well Patient left: in chair;with call bell/phone within reach;with family/visitor present Nurse Communication: Mobility status         Time: 1030-1056 PT Time Calculation (min): 26 min   Charges:   PT Evaluation $Initial PT Evaluation Tier I: 1 Procedure PT Treatments $Gait Training: 23-37 mins   PT G Codes:          Patrina Andreas 03/09/2014, 11:07 AM

## 2014-02-09 NOTE — Progress Notes (Signed)
Subjective: 1 Day Post-Op Procedure(s) (LRB): CLOSED REDUCTION HIP (Left) Patient reports pain as 1 on 0-10 scale.    Objective: Vital signs in last 24 hours: Temp:  [98 F (36.7 C)-98.6 F (37 C)] 98.4 F (36.9 C) (06/12 0600) Pulse Rate:  [64-89] 89 (06/12 0600) Resp:  [13-22] 17 (06/12 0600) BP: (114-164)/(48-119) 118/62 mmHg (06/12 0600) SpO2:  [95 %-100 %] 97 % (06/12 0600) Weight:  [78.019 kg (172 lb)] 78.019 kg (172 lb) (06/11 1530)  Intake/Output from previous day: 06/11 0701 - 06/12 0700 In: 2307.5 [P.O.:600; I.V.:1707.5] Out: 1975 [MLYYT:0354] Intake/Output this shift: Total I/O In: 632.5 [I.V.:632.5] Out: 1175 [Urine:1175]   Recent Labs  02/08/14 1623  HGB 12.4*    Recent Labs  02/08/14 1623  WBC 12.6*  RBC 4.05*  HCT 36.8*  PLT 333    Recent Labs  02/08/14 1623  CREATININE 0.78   No results found for this basename: LABPT, INR,  in the last 72 hours  Neurologically intact Sensation intact distally Compartment soft  Assessment/Plan: 1 Day Post-Op Procedure(s) (LRB): CLOSED REDUCTION HIP (Left) Up with therapy Discharge home with home health F/U Dr. Alvan Dame 2 weeks  Jordan [name] C 02/09/2014, 6:51 AM

## 2016-09-18 ENCOUNTER — Other Ambulatory Visit
Admission: RE | Admit: 2016-09-18 | Discharge: 2016-09-18 | Disposition: A | Discharge status: Home / Self Care | Payer: Medicare Other | Source: Ambulatory Visit | Attending: Ophthalmology | Admitting: Ophthalmology

## 2016-09-18 ENCOUNTER — Encounter: Payer: Self-pay | Admitting: Ophthalmology

## 2016-09-18 DIAGNOSIS — H47011 Ischemic optic neuropathy, right eye: Secondary | ICD-10-CM | POA: Insufficient documentation

## 2016-09-18 LAB — PLATELET COUNT: Platelets: 326 10*3/uL (ref 150–440)

## 2016-09-18 LAB — SEDIMENTATION RATE: Sed Rate: 71 mm/hr — ABNORMAL HIGH (ref 0–20)

## 2016-09-18 LAB — C-REACTIVE PROTEIN: CRP: <0.8 mg/dL (ref <1.0)

## 2016-09-25 ENCOUNTER — Encounter
Admission: RE | Admit: 2016-09-25 | Discharge: 2016-09-25 | Disposition: A | Discharge status: Home / Self Care | Payer: Medicare Other | Source: Ambulatory Visit | Attending: Vascular Surgery | Admitting: Vascular Surgery

## 2016-09-25 ENCOUNTER — Ambulatory Visit (INDEPENDENT_AMBULATORY_CARE_PROVIDER_SITE_OTHER): Payer: Medicare Other | Admitting: Vascular Surgery

## 2016-09-25 ENCOUNTER — Encounter (INDEPENDENT_AMBULATORY_CARE_PROVIDER_SITE_OTHER): Payer: Self-pay

## 2016-09-25 ENCOUNTER — Encounter (INDEPENDENT_AMBULATORY_CARE_PROVIDER_SITE_OTHER): Payer: Self-pay | Admitting: Vascular Surgery

## 2016-09-25 VITALS — BP 144/68 | HR 65 | Resp 16 | Ht 73.0 in | Wt 174.0 lb

## 2016-09-25 DIAGNOSIS — C61 Malignant neoplasm of prostate: Secondary | ICD-10-CM

## 2016-09-25 DIAGNOSIS — M316 Other giant cell arteritis: Secondary | ICD-10-CM | POA: Insufficient documentation

## 2016-09-25 DIAGNOSIS — I708 Atherosclerosis of other arteries: Secondary | ICD-10-CM | POA: Diagnosis not present

## 2016-09-25 DIAGNOSIS — E039 Hypothyroidism, unspecified: Secondary | ICD-10-CM | POA: Diagnosis not present

## 2016-09-25 DIAGNOSIS — Z79899 Other long term (current) drug therapy: Secondary | ICD-10-CM | POA: Diagnosis not present

## 2016-09-25 DIAGNOSIS — Z9889 Other specified postprocedural states: Secondary | ICD-10-CM | POA: Diagnosis not present

## 2016-09-25 DIAGNOSIS — Z8546 Personal history of malignant neoplasm of prostate: Secondary | ICD-10-CM | POA: Diagnosis not present

## 2016-09-25 DIAGNOSIS — Z7982 Long term (current) use of aspirin: Secondary | ICD-10-CM | POA: Diagnosis not present

## 2016-09-25 DIAGNOSIS — Z96642 Presence of left artificial hip joint: Secondary | ICD-10-CM | POA: Diagnosis not present

## 2016-09-25 DIAGNOSIS — H5461 Unqualified visual loss, right eye, normal vision left eye: Secondary | ICD-10-CM | POA: Diagnosis not present

## 2016-09-25 LAB — CBC
HEMATOCRIT: 35.7 % — AB (ref 40.0–52.0)
HEMOGLOBIN: 12.4 g/dL — AB (ref 13.0–18.0)
MCH: 31.2 pg (ref 26.0–34.0)
MCHC: 34.7 g/dL (ref 32.0–36.0)
MCV: 90 fL (ref 80.0–100.0)
Platelets: 354 10*3/uL (ref 150–440)
RBC: 3.97 MIL/uL — ABNORMAL LOW (ref 4.40–5.90)
RDW: 14.5 % (ref 11.5–14.5)
WBC: 11 10*3/uL — AB (ref 3.8–10.6)

## 2016-09-25 MED ORDER — LACTATED RINGERS IV SOLN: INTRAVENOUS | Status: DC

## 2016-09-25 NOTE — Patient Instructions (Signed)
Temporal Artery Biopsy Arteries are blood vessels that carry blood from the heart to the rest of the body. Temporal arteries are found in your temples, which are on the side of the head and between the ears and eyes. Temporal artery biopsy is a procedure that removes a sample of the temporal artery. The sample is then examined under a microscope. This procedure may be done to check to see if you have a condition that causes your arteries to become swollen or inflamed (temporal arteritis or giant cell arteritis). Tell a health care provider about:  Any allergies you have.  All medicines you are taking, including vitamins, herbs, eye drops, creams, and over-the-counter medicines.  Any problems you or family members have had with anesthetic medicines.  Any blood disorders you have.  Any surgeries you have had.  Any medical conditions you have.  Whether you are pregnant or may be pregnant. What are the risks? Generally, this is a safe procedure. However, problems may occur, including:  Bleeding.  A collection of blood under the skin (hematoma).  Infection.  Nerve damage in the temples. This can cause numbness or make the muscles in your face weak.  Scarring. On the scalp, hair may not grow around the scar. What happens before the procedure?  You may have blood tests to make sure that your blood clots normally.  Ask your health care provider about:  Changing or stopping your regular medicines. This is especially important if you are taking diabetes medicines or blood thinners.  Taking medicines such as aspirin and ibuprofen. These medicines can thin your blood. Do not take these medicines before your procedure if your health care provider instructs you not to.  Follow instructions from your health care provider about eating or drinking restrictions.  Plan to have someone take you home after the procedure.  If you go home right after the procedure, plan to have someone with you  for 24 hours.  Ask your health care provider how your surgical site will be marked or identified.  You may be given antibiotic medicine to help prevent infection. What happens during the procedure?  To reduce your risk of infection:  Your health care team will wash or sanitize their hands.  Your skin will be washed with soap.  Small monitors will be put on your body. They will be used to check your heart, blood pressure, and oxygen level.  An IV tube will be inserted into one of your veins.  You will be given one or more of the following:  A medicine to help you relax (sedative).  A medicine to numb the area (local anesthetic).  A tool that looks like a pencil and uses sound waves (Doppler) may be used to locate the artery.  A cut (incision) will be made over the temporal artery.  Two clamps will be placed on the artery. Then, a small piece of the artery between the clamps will be cut and removed.  The remaining ends of the artery will be closed securely with stitches (sutures) to avoid bleeding. The clamps will then be removed.  The incision will be closed with sutures.  A bandage (dressing) may be placed on the incision.  The artery piece that was taken out will be checked under a microscope. The procedure may vary among health care providers and hospitals. What happens after the procedure?  Your blood pressure, heart rate, breathing rate, and blood oxygen level will be monitored often until the medicines you were given  have worn off.  You may be given medicine for pain. This information is not intended to replace advice given to you by your health care provider. Make sure you discuss any questions you have with your health care provider. Document Released: 08/05/2009 Document Revised: 04/16/2016 Document Reviewed: 02/04/2015 Elsevier Interactive Patient Education  2017 [employer].

## 2016-09-25 NOTE — Addendum Note (Signed)
Addended by: Algernon Huxley on: 09/25/2016 01:47 PM   Modules accepted: Orders, SmartSet

## 2016-09-25 NOTE — Patient Instructions (Signed)
Your procedure is scheduled on: Monday 09/28/16 AT 11:OO Report to [place]. 2ND FLOOR MEDICAL MALL ENTRANCE. To find out your arrival time please call [phone] between 1PM - 3PM on TODAY.  Remember: Instructions that are not followed completely may result in serious medical risk, up to and including death, or upon the discretion of your surgeon and anesthesiologist your surgery may need to be rescheduled.    __X__ 1. Do not eat food or drink liquids after midnight. No gum chewing or hard candies.     __X__ 2. No Alcohol for 24 hours before or after surgery.   ____ 3. Bring all medications with you on the day of surgery if instructed.    __X__ 4. Notify your doctor if there is any change in your medical condition     (cold, fever, infections).             __X___5. No smoking within 24 hours of your surgery.     Do not wear jewelry, make-up, hairpins, clips or nail polish.  Do not wear lotions, powders, or perfumes.   Do not shave 48 hours prior to surgery. Men may shave face and neck.  Do not bring valuables to the hospital.    [facility] is not responsible for any belongings or valuables.               Contacts, dentures or bridgework may not be worn into surgery.  Leave your suitcase in the car. After surgery it may be brought to your room.  For patients admitted to the hospital, discharge time is determined by your                treatment team.   Patients discharged the day of surgery will not be allowed to drive home.   Please read over the following fact sheets that you were given:   MRSA Information   __X__ Take these medicines the morning of surgery with A SIP OF WATER:    1. PREDNISONE  2. LEVOTHYROXINE  3.   4.  5.  6.  ____ Fleet Enema (as directed)   ____ Use CHG Soap as directed  ____ Use inhalers on the day of surgery  ____ Stop metformin 2 days prior to surgery    ____ Take 1/2 of usual insulin dose the night before surgery and none on the  morning of surgery.   __X__ Stop Coumadin/Plavix/aspirin on today  __X__ Stop Anti-inflammatories such as Advil, Aleve, Ibuprofen, Motrin, Naproxen, Naprosyn, Goodies,powder, or aspirin products.  OK to take Tylenol.   ____ Stop supplements until after surgery.    ____ Bring C-Pap to the hospital.

## 2016-09-25 NOTE — Progress Notes (Signed)
Patient ID: Jordan [name], male   DOB: Mar 05, 1924, 83 y.o.   MRN: [id number]  Chief Complaint  Patient presents with  . New Patient (Initial Visit)    HPI Jordan [name] is a 83 y.o. male.  I am asked to see the patient by Dr. Edison Pace at [facility] for evaluation of vision loss and need for a temporal artery biopsy..  The patient reports about a week or 2 ago developing visual loss in the right eye. He still says the eyes somewhat fuzzy particularly on the lower half of his visual field. He denies headaches. No chest pain or shortness of breath. He has no other obvious systemic complaints. He was started on steroids without any obvious improvement thus far. Of concern, his sedimentation rate was significantly elevated at 70. For this reason, temporal arteritis was highly suspected.   Past Medical History:  Diagnosis Date  . Cancer [facility])    prostate  . Prostate cancer ([place])   . Thyroid disease     Past Surgical History:  Procedure Laterality Date  . APPENDECTOMY    . HIP CLOSED REDUCTION Left 02/08/2014   Procedure: CLOSED REDUCTION HIP;  Surgeon: Johnn Hai, MD;  Location: [facility];  Service: Orthopedics;  Laterality: Left;  . PROSTATE SURGERY    . TOTAL HIP ARTHROPLASTY      Family History  Problem Relation Age of Onset  . Cancer Father   No bleeding disorders, clotting disorders, or autoimmune diseases.  Social History Social History  Substance Use Topics  . Smoking status: Never Smoker  . Smokeless tobacco: Never Used  . Alcohol use No  Still lives independently. No IV drug use.  Allergies  Allergen Reactions  . Penicillins     unknown  . Tetanus Toxoids     unknown    Current Outpatient Prescriptions  Medication Sig Dispense Refill  . aspirin EC 81 MG tablet Take 4 tablets (325 mg total) by mouth every evening. 60 tablet 0  . B Complex Vitamins (VITAMIN B COMPLEX IJ) Inject as directed every 30 (thirty) days.    . predniSONE (DELTASONE) 20 MG  tablet     . SYNTHROID 50 MCG tablet Take 50 mcg by mouth daily before breakfast.     . vitamin E 400 UNIT capsule Take 400 Units by mouth daily.     No current facility-administered medications for this visit.       REVIEW OF SYSTEMS (Negative unless checked)  Constitutional: [] Weight loss  [] Fever  [] Chills Cardiac: [] Chest pain   [] Chest pressure   [] Palpitations   [] Shortness of breath when laying flat   [] Shortness of breath at rest   [] Shortness of breath with exertion. Vascular:  [] Pain in legs with walking   [] Pain in legs at rest   [] Pain in legs when laying flat   [] Claudication   [] Pain in feet when walking  [] Pain in feet at rest  [] Pain in feet when laying flat   [] History of DVT   [] Phlebitis   [] Swelling in legs   [] Varicose veins   [] Non-healing ulcers Pulmonary:   [] Uses home oxygen   [] Productive cough   [] Hemoptysis   [] Wheeze  [] COPD   [] Asthma Neurologic:  [] Dizziness  [] Blackouts   [] Seizures   [] History of stroke   [] History of TIA  [] Aphasia   [] Temporary blindness   [] Dysphagia   [] Weakness or numbness in arms   [] Weakness or numbness in legs . positive for visual loss  in the right eye  Musculoskeletal:  [x] Arthritis   [] Joint swelling   [] Joint pain   [] Low back pain Hematologic:  [] Easy bruising  [] Easy bleeding   [] Hypercoagulable state   [] Anemic  [] Hepatitis Gastrointestinal:  [] Blood in stool   [] Vomiting blood  [] Gastroesophageal reflux/heartburn   [] Abdominal pain Genitourinary:  [] Chronic kidney disease   [] Difficult urination  [] Frequent urination  [] Burning with urination   [] Hematuria Skin:  [] Rashes   [] Ulcers   [] Wounds Psychological:  [] History of anxiety   []  History of major depression.    Physical Exam BP (!) 144/68   Pulse 65   Resp 16   Ht 6\' 1"  (1.854 m)   Wt 78.9 kg (174 lb)   BMI 22.96 kg/m  Gen:  WD/WN, NAD. Appears younger than stated age. Head: [place]/AT, No temporalis wasting. Prominent temp pulse not noted. Ear/Nose/Throat: Hearing  grossly intact, nares w/o erythema or drainage, oropharynx w/o Erythema/Exudate Eyes: Conjunctiva clear, sclera non-icteric  Neck: trachea midline.  No bruit or JVD.  Pulmonary:  Good air movement, clear to auscultation bilaterally.  Cardiac: RRR, normal S1, S2, no Murmurs, rubs or gallops. Vascular:  Vessel Right Left  Radial Palpable Palpable                                   Gastrointestinal: soft, non-tender/non-distended. No guarding/reflex. No masses, surgical incisions, or scars. Musculoskeletal: M/S 5/5 throughout.  Extremities without ischemic changes.  No deformity or atrophy. Walks with a cane  Neurologic: Sensation grossly intact in extremities.  Symmetrical.  Speech is fluent. Motor exam as listed above. Psychiatric: Judgment intact, Mood & affect appropriate for pt's clinical situation. Dermatologic: No rashes or ulcers noted.  No cellulitis or open wounds. Lymph : No Cervical, Axillary, or Inguinal lymphadenopathy.   Radiology No results found.  Labs Recent Results (from the past 2160 hour(s))  Platelet count     Status: None   Collection Time: 09/18/16  4:23 PM  Result Value Ref Range   Platelets 326 150 - 440 K/uL  Sedimentation rate     Status: Abnormal   Collection Time: 09/18/16  4:23 PM  Result Value Ref Range   Sed Rate 71 (H) 0 - 20 mm/hr  C-reactive protein     Status: None   Collection Time: 09/18/16  4:23 PM  Result Value Ref Range   CRP <0.8 <1.0 mg/dL    Comment: Performed at [facility], [place] [address], [place],  [address]    Assessment/Plan:  Hypothyroidism On replacement therapy and doing well.  Prostate cancer ([place]) No current symptoms. Doing well.  Temporal arteritis ([place]) The patient has visual loss and a markedly elevated sedimentation rate worrisome for temporal arteritis. I long discussion with patient and his son today. Given the concern, the only way to definitively diagnose temporal arteritis is with a  temporal artery biopsy. Given that his symptoms are on the right, a right temporal artery biopsy would be recommended. Risks and benefits the procedure were discussed. The patient is agreeable to proceed and is scheduled for the near future.      Leotis Pain 09/25/2016, 11:28 AM   This note was created with Dragon medical transcription system.  Any errors from dictation are unintentional.

## 2016-09-25 NOTE — Assessment & Plan Note (Signed)
The patient has visual loss and a markedly elevated sedimentation rate worrisome for temporal arteritis. I long discussion with patient and his son today. Given the concern, the only way to definitively diagnose temporal arteritis is with a temporal artery biopsy. Given that his symptoms are on the right, a right temporal artery biopsy would be recommended. Risks and benefits the procedure were discussed. The patient is agreeable to proceed and is scheduled for the near future.

## 2016-09-25 NOTE — Assessment & Plan Note (Signed)
No current symptoms. Doing well.

## 2016-09-25 NOTE — Assessment & Plan Note (Signed)
On replacement therapy and doing well.

## 2016-09-28 ENCOUNTER — Encounter
Admission: RE | Disposition: A | Discharge status: Home / Self Care | Payer: Self-pay | Source: Ambulatory Visit | Attending: Vascular Surgery

## 2016-09-28 ENCOUNTER — Ambulatory Visit
Admission: RE | Admit: 2016-09-28 | Discharge: 2016-09-28 | Disposition: A | Discharge status: Home / Self Care | Payer: Medicare Other | Source: Ambulatory Visit | Attending: Vascular Surgery | Admitting: Vascular Surgery

## 2016-09-28 ENCOUNTER — Ambulatory Visit: Payer: Medicare Other | Admitting: Anesthesiology

## 2016-09-28 ENCOUNTER — Encounter: Payer: Self-pay | Admitting: *Deleted

## 2016-09-28 ENCOUNTER — Other Ambulatory Visit (INDEPENDENT_AMBULATORY_CARE_PROVIDER_SITE_OTHER): Payer: Self-pay | Admitting: Vascular Surgery

## 2016-09-28 DIAGNOSIS — Z9889 Other specified postprocedural states: Secondary | ICD-10-CM | POA: Insufficient documentation

## 2016-09-28 DIAGNOSIS — E039 Hypothyroidism, unspecified: Secondary | ICD-10-CM | POA: Insufficient documentation

## 2016-09-28 DIAGNOSIS — Z7982 Long term (current) use of aspirin: Secondary | ICD-10-CM | POA: Insufficient documentation

## 2016-09-28 DIAGNOSIS — Z79899 Other long term (current) drug therapy: Secondary | ICD-10-CM | POA: Insufficient documentation

## 2016-09-28 DIAGNOSIS — I708 Atherosclerosis of other arteries: Secondary | ICD-10-CM | POA: Insufficient documentation

## 2016-09-28 DIAGNOSIS — Z96642 Presence of left artificial hip joint: Secondary | ICD-10-CM | POA: Insufficient documentation

## 2016-09-28 DIAGNOSIS — H5461 Unqualified visual loss, right eye, normal vision left eye: Secondary | ICD-10-CM | POA: Insufficient documentation

## 2016-09-28 DIAGNOSIS — M316 Other giant cell arteritis: Secondary | ICD-10-CM | POA: Diagnosis not present

## 2016-09-28 DIAGNOSIS — Z8546 Personal history of malignant neoplasm of prostate: Secondary | ICD-10-CM | POA: Insufficient documentation

## 2016-09-28 HISTORY — PX: ARTERY BIOPSY: SHX891

## 2016-09-28 LAB — PROTIME-INR
INR: 1.11
Prothrombin Time: 14.3 seconds (ref 11.4–15.2)

## 2016-09-28 LAB — CBC WITH DIFFERENTIAL/PLATELET
Basophils Absolute: 0 10*3/uL (ref 0–0.1)
Basophils Relative: 0 %
Eosinophils Absolute: 0 10*3/uL (ref 0–0.7)
Eosinophils Relative: 0 %
HEMATOCRIT: 36.5 % — AB (ref 40.0–52.0)
HEMOGLOBIN: 12.1 g/dL — AB (ref 13.0–18.0)
LYMPHS ABS: 1.1 10*3/uL (ref 1.0–3.6)
Lymphocytes Relative: 9 %
MCH: 30 pg (ref 26.0–34.0)
MCHC: 33.1 g/dL (ref 32.0–36.0)
MCV: 90.6 fL (ref 80.0–100.0)
MONO ABS: 0.4 10*3/uL (ref 0.2–1.0)
MONOS PCT: 3 %
NEUTROS ABS: 11.2 10*3/uL — AB (ref 1.4–6.5)
NEUTROS PCT: 88 %
Platelets: 363 10*3/uL (ref 150–440)
RBC: 4.03 MIL/uL — ABNORMAL LOW (ref 4.40–5.90)
RDW: 14.2 % (ref 11.5–14.5)
WBC: 12.6 10*3/uL — ABNORMAL HIGH (ref 3.8–10.6)

## 2016-09-28 LAB — BASIC METABOLIC PANEL
ANION GAP: 6 (ref 5–15)
BUN: 21 mg/dL — ABNORMAL HIGH (ref 6–20)
CHLORIDE: 102 mmol/L (ref 101–111)
CO2: 28 mmol/L (ref 22–32)
CREATININE: 0.9 mg/dL (ref 0.61–1.24)
Calcium: 8.8 mg/dL — ABNORMAL LOW (ref 8.9–10.3)
GFR calc non Af Amer: >60 mL/min (ref >60)
GLUCOSE: 127 mg/dL — AB (ref 65–99)
Potassium: 3.2 mmol/L — ABNORMAL LOW (ref 3.5–5.1)
Sodium: 136 mmol/L (ref 135–145)

## 2016-09-28 LAB — TYPE AND SCREEN
ABO/RH(D): O POS
Antibody Screen: NEGATIVE

## 2016-09-28 LAB — APTT: aPTT: 27 seconds (ref 24–36)

## 2016-09-28 SURGERY — BIOPSY TEMPORAL ARTERY
Anesthesia: General

## 2016-09-28 SURGERY — BIOPSY TEMPORAL ARTERY
Anesthesia: Choice | Laterality: Right

## 2016-09-28 MED ORDER — FENTANYL CITRATE (PF) 100 MCG/2ML IJ SOLN: 25.0000 ug | INTRAMUSCULAR | Status: DC | PRN

## 2016-09-28 MED ORDER — CLINDAMYCIN PHOSPHATE 300 MG/50ML IV SOLN
INTRAVENOUS | Status: AC
  Filled 2016-09-28: qty 50

## 2016-09-28 MED ORDER — SODIUM CHLORIDE 0.9 % IV SOLN
INTRAVENOUS | Status: DC
  Administered 2016-09-28: 12:00:00 via INTRAVENOUS

## 2016-09-28 MED ORDER — EPHEDRINE 5 MG/ML INJ
INTRAVENOUS | Status: AC
  Filled 2016-09-28: qty 10

## 2016-09-28 MED ORDER — DEXAMETHASONE SODIUM PHOSPHATE 10 MG/ML IJ SOLN
INTRAMUSCULAR | Status: DC | PRN
  Administered 2016-09-28: 10 mg via INTRAVENOUS

## 2016-09-28 MED ORDER — EPINEPHRINE PF 1 MG/ML IJ SOLN
INTRAMUSCULAR | Status: AC
  Filled 2016-09-28: qty 1

## 2016-09-28 MED ORDER — CHLORHEXIDINE GLUCONATE CLOTH 2 % EX PADS: 6.0000 | MEDICATED_PAD | Freq: Once | CUTANEOUS | Status: DC

## 2016-09-28 MED ORDER — CLINDAMYCIN PHOSPHATE 300 MG/50ML IV SOLN
300.0000 mg | Freq: Once | INTRAVENOUS | Status: AC
  Administered 2016-09-28: 300 mg via INTRAVENOUS

## 2016-09-28 MED ORDER — ONDANSETRON HCL 4 MG/2ML IJ SOLN: 4.0000 mg | Freq: Once | INTRAMUSCULAR | Status: DC | PRN

## 2016-09-28 MED ORDER — ONDANSETRON HCL 4 MG/2ML IJ SOLN
INTRAMUSCULAR | Status: DC | PRN
  Administered 2016-09-28: 4 mg via INTRAVENOUS

## 2016-09-28 MED ORDER — PROPOFOL 10 MG/ML IV BOLUS
INTRAVENOUS | Status: DC | PRN
  Administered 2016-09-28: 100 mg via INTRAVENOUS

## 2016-09-28 MED ORDER — FAMOTIDINE 20 MG PO TABS
20.0000 mg | ORAL_TABLET | Freq: Once | ORAL | Status: AC
  Administered 2016-09-28: 20 mg via ORAL

## 2016-09-28 MED ORDER — PROPOFOL 10 MG/ML IV BOLUS
INTRAVENOUS | Status: AC
  Filled 2016-09-28: qty 20

## 2016-09-28 MED ORDER — LIDOCAINE HCL (PF) 1 % IJ SOLN
INTRAMUSCULAR | Status: AC
  Filled 2016-09-28: qty 30

## 2016-09-28 MED ORDER — FAMOTIDINE 20 MG PO TABS
ORAL_TABLET | ORAL | Status: AC
  Filled 2016-09-28: qty 1

## 2016-09-28 MED ORDER — FENTANYL CITRATE (PF) 100 MCG/2ML IJ SOLN
INTRAMUSCULAR | Status: DC | PRN
  Administered 2016-09-28 (×3): 25 ug via INTRAVENOUS

## 2016-09-28 MED ORDER — DEXAMETHASONE SODIUM PHOSPHATE 10 MG/ML IJ SOLN
INTRAMUSCULAR | Status: AC
  Filled 2016-09-28: qty 1

## 2016-09-28 MED ORDER — FENTANYL CITRATE (PF) 100 MCG/2ML IJ SOLN
INTRAMUSCULAR | Status: AC
  Filled 2016-09-28: qty 2

## 2016-09-28 MED ORDER — EPHEDRINE SULFATE 50 MG/ML IJ SOLN
INTRAMUSCULAR | Status: DC | PRN
  Administered 2016-09-28: 5 mg via INTRAVENOUS
  Administered 2016-09-28: 10 mg via INTRAVENOUS

## 2016-09-28 MED ORDER — TRAMADOL HCL 50 MG PO TABS: 50.0000 mg | ORAL_TABLET | Freq: Four times a day (QID) | ORAL | 0 refills | Status: AC | PRN

## 2016-09-28 MED ORDER — LIDOCAINE HCL (PF) 2 % IJ SOLN
INTRAMUSCULAR | Status: AC
  Filled 2016-09-28: qty 2

## 2016-09-28 MED ORDER — ONDANSETRON HCL 4 MG/2ML IJ SOLN
INTRAMUSCULAR | Status: AC
  Filled 2016-09-28: qty 2

## 2016-09-28 SURGICAL SUPPLY — 38 items
BLADE SURG 15 STRL LF DISP TIS (BLADE) ×1 IMPLANT
BLADE SURG 15 STRL SS (BLADE) ×2
BLADE SURG SZ11 CARB STEEL (BLADE) ×3 IMPLANT
CNTNR SPEC 2.5X3XGRAD LEK (MISCELLANEOUS)
CONT SPEC 4OZ STER OR WHT (MISCELLANEOUS)
CONTAINER SPEC 2.5X3XGRAD LEK (MISCELLANEOUS) IMPLANT
COTTON BALL STRL MEDIUM (GAUZE/BANDAGES/DRESSINGS) ×3 IMPLANT
DERMABOND ADVANCED (GAUZE/BANDAGES/DRESSINGS) ×2
DERMABOND ADVANCED .7 DNX12 (GAUZE/BANDAGES/DRESSINGS) ×1 IMPLANT
DRAPE LAPAROTOMY 77X122 PED (DRAPES) ×3 IMPLANT
DRESSING TELFA 4X3 1S ST N-ADH (GAUZE/BANDAGES/DRESSINGS) ×3 IMPLANT
ELECT CAUTERY BLADE 6.4 (BLADE) ×3 IMPLANT
ELECT REM PT RETURN 9FT ADLT (ELECTROSURGICAL) ×3
ELECTRODE REM PT RTRN 9FT ADLT (ELECTROSURGICAL) ×1 IMPLANT
GLOVE BIO SURGEON STRL SZ7 (GLOVE) ×3 IMPLANT
GOWN L4 XLG 20 PK N/S (GOWN DISPOSABLE) ×3 IMPLANT
GOWN STRL REUS W/ TWL LRG LVL3 (GOWN DISPOSABLE) ×1 IMPLANT
GOWN STRL REUS W/TWL LRG LVL3 (GOWN DISPOSABLE) ×2
KIT RM TURNOVER STRD PROC AR (KITS) ×3 IMPLANT
LABEL OR SOLS (LABEL) ×3 IMPLANT
NEEDLE HYPO 25X1 1.5 SAFETY (NEEDLE) IMPLANT
NS IRRIG 500ML POUR BTL (IV SOLUTION) ×3 IMPLANT
PACK BASIN MINOR ARMC (MISCELLANEOUS) ×3 IMPLANT
SOL PREP PVP 2OZ (MISCELLANEOUS) ×3
SOLUTION PREP PVP 2OZ (MISCELLANEOUS) ×1 IMPLANT
SUCTION FRAZIER HANDLE 10FR (MISCELLANEOUS) ×2
SUCTION TUBE FRAZIER 10FR DISP (MISCELLANEOUS) ×1 IMPLANT
SUT MNCRL AB 4-0 PS2 18 (SUTURE) ×3 IMPLANT
SUT SILK 2 0 (SUTURE) ×2
SUT SILK 2-0 18XBRD TIE 12 (SUTURE) ×1 IMPLANT
SUT SILK 3 0 (SUTURE) ×2
SUT SILK 3-0 18XBRD TIE 12 (SUTURE) ×1 IMPLANT
SUT SILK 4 0 (SUTURE) ×2
SUT SILK 4-0 18XBRD TIE 12 (SUTURE) ×1 IMPLANT
SUT VIC AB 3-0 SH 27 (SUTURE) ×2
SUT VIC AB 3-0 SH 27X BRD (SUTURE) ×1 IMPLANT
SYR BULB IRRIG 60ML STRL (SYRINGE) ×3 IMPLANT
SYRINGE 10CC LL (SYRINGE) IMPLANT

## 2016-09-28 NOTE — Anesthesia Procedure Notes (Signed)
Procedure Name: LMA Insertion Date/Time: 09/28/2016 12:31 PM Performed by: Hedda Slade Pre-anesthesia Checklist: Patient identified, Emergency Drugs available, Suction available and Patient being monitored Patient Re-evaluated:Patient Re-evaluated prior to inductionOxygen Delivery Method: Circle system utilized Preoxygenation: Pre-oxygenation with 100% oxygen Intubation Type: IV induction Ventilation: Mask ventilation without difficulty LMA: LMA inserted LMA Size: 4.5 Number of attempts: 1 Placement Confirmation: positive ETCO2 and breath sounds checked- equal and bilateral Tube secured with: Tape Dental Injury: Teeth and Oropharynx as per pre-operative assessment

## 2016-09-28 NOTE — Anesthesia Preprocedure Evaluation (Signed)
Anesthesia Evaluation  Patient identified by MRN, date of birth, ID band Patient awake    Reviewed: Allergy & Precautions, H&P , NPO status , Patient's Chart, lab work & pertinent test results  Airway Mallampati: II  TM Distance: >3 FB Neck ROM: full    Dental no notable dental hx.    Pulmonary neg pulmonary ROS,    Pulmonary exam normal breath sounds clear to auscultation       Cardiovascular Exercise Tolerance: Good + Peripheral Vascular Disease  negative cardio ROS Normal cardiovascular exam Rhythm:regular Rate:Normal     Neuro/Psych negative neurological ROS  negative psych ROS   GI/Hepatic negative GI ROS, Neg liver ROS,   Endo/Other  negative endocrine ROSHypothyroidism   Renal/GU negative Renal ROS  negative genitourinary   Musculoskeletal  (+) Arthritis , Osteoarthritis,    Abdominal   Peds negative pediatric ROS (+)  Hematology negative hematology ROS (+)   Anesthesia Other Findings Past Medical History: No date: Cancer [facility])     Comment: prostate No date: Prostate cancer ([place]) No date: Thyroid disease  Reproductive/Obstetrics negative OB ROS                             Anesthesia Physical  Anesthesia Plan  ASA: III  Anesthesia Plan:    Post-op Pain Management:    Induction: Intravenous  Airway Management Planned:   Additional Equipment:   Intra-op Plan:   Post-operative Plan:   Informed Consent: I have reviewed the patients History and Physical, chart, labs and discussed the procedure including the risks, benefits and alternatives for the proposed anesthesia with the patient or authorized representative who has indicated his/her understanding and acceptance.   Dental Advisory Given  Plan Discussed with: CRNA and Surgeon  Anesthesia Plan Comments:         Anesthesia Quick Evaluation

## 2016-09-28 NOTE — Anesthesia Post-op Follow-up Note (Cosign Needed)
Anesthesia QCDR form completed.        

## 2016-09-28 NOTE — Transfer of Care (Signed)
Immediate Anesthesia Transfer of Care Note  Patient: Jordan [name]  Procedure(s) Performed: Procedure(s): BIOPSY TEMPORAL ARTERY (N/A)  Patient Location: PACU  Anesthesia Type:General  Level of Consciousness: sedated  Airway & Oxygen Therapy: Patient Spontanous Breathing and Patient connected to face mask oxygen  Post-op Assessment: Report given to RN and Post -op Vital signs reviewed and stable  Post vital signs: Reviewed and stable  Last Vitals:  Vitals:   09/28/16 1102 09/28/16 1313  BP: (!) 150/78 124/63  Pulse: 63 (!) 59  Resp: 18 15  Temp: 36.7 C 36.3 C    Last Pain:  Vitals:   09/28/16 1313  TempSrc: Temporal         Complications: No apparent anesthesia complications

## 2016-09-28 NOTE — H&P (Signed)
[facility] History & Physical Update  The patient was interviewed and re-examined.  The patient's previous History and Physical has been reviewed and is unchanged.  There is no change in the plan of care. We plan to proceed with the scheduled procedure.  Leotis Pain, MD  09/28/2016, 12:15 PM

## 2016-09-28 NOTE — Discharge Instructions (Signed)

## 2016-09-28 NOTE — Op Note (Signed)
        OPERATIVE NOTE   PRE-OPERATIVE DIAGNOSIS: suspected temporal arteritis, visual loss right ete  POST-OPERATIVE DIAGNOSIS: Same as above  PROCEDURE: 1.   Right temporal artery biopsy  SURGEON: Leotis Pain, MD  ASSISTANT(S): none  ANESTHESIA: general  ESTIMATED BLOOD LOSS: Minimal  FINDING(S): 1.  none  SPECIMEN(S):  Right  superficial temporal artery sent to pathology  INDICATIONS:   Patient is a 83 y.o. male who presents with visual loss in the right eye and an elevated ESRD worrisome for temporal arteritis. We were consulted by ophthalmology for consideration for temporal artery biopsy. Risks and benefits were discussed and he was agreeable to proceed.  DESCRIPTION: After obtaining full informed written consent, the patient was brought back to the operating room and placed supine upon the operating table.  The patient received IV antibiotics prior to induction.  After obtaining adequate anesthesia, the patient was prepped and draped in the standard fashion. I then made an incision just in front of the right ear overlying the palpable pulse. I then dissected down through the subcutaneous tissues and identified the superficial temporal artery. This was dissected out over a several centimeters and branches were ligated and divided between silk ties. Care was used to avoid electrocautery around the artery. I then clamped the artery proximally and distally and transected the artery. The specimen was then sent to pathology. The proximal and distal artery were ligated with 3-0 silk ties. Hemostasis was achieved. The wound was then closed with a series of interrupted 3-0 Vicryl's and the skin was closed with a 4-0 Monocryl. Sterile dressing was placed. The patient was taken to the recovery room in stable condition having tolerated the procedure well.  COMPLICATIONS: None  CONDITION: Stable   Leotis Pain 09/28/2016 2:36 PM  This note was created with Dragon Medical transcription  system. Any errors in dictation are purely unintentional.

## 2016-09-28 NOTE — Progress Notes (Signed)
Top teeth in and hearing aides in bilateral

## 2016-09-29 ENCOUNTER — Encounter: Payer: Self-pay | Admitting: Vascular Surgery

## 2016-09-29 LAB — SURGICAL PATHOLOGY

## 2016-09-29 NOTE — Anesthesia Postprocedure Evaluation (Signed)
Anesthesia Post Note  Patient: Jordan [name]  Procedure(s) Performed: Procedure(s) (LRB): BIOPSY TEMPORAL ARTERY (N/A)  Patient location during evaluation: PACU Anesthesia Type: General Level of consciousness: awake and alert Pain management: pain level controlled Vital Signs Assessment: post-procedure vital signs reviewed and stable Respiratory status: spontaneous breathing, nonlabored ventilation, respiratory function stable and patient connected to nasal cannula oxygen Cardiovascular status: blood pressure returned to baseline and stable Postop Assessment: no signs of nausea or vomiting Anesthetic complications: no     Last Vitals:  Vitals:   09/28/16 1405 09/28/16 1414  BP: 131/63 132/64  Pulse: 66 68  Resp: 15 14  Temp: 36.7 C     Last Pain:  Vitals:   09/28/16 1313  TempSrc: Temporal                 Martha Clan

## 2016-10-19 ENCOUNTER — Ambulatory Visit (INDEPENDENT_AMBULATORY_CARE_PROVIDER_SITE_OTHER): Payer: Medicare Other | Admitting: Vascular Surgery

## 2016-10-20 ENCOUNTER — Ambulatory Visit (INDEPENDENT_AMBULATORY_CARE_PROVIDER_SITE_OTHER): Payer: Medicare Other | Admitting: Vascular Surgery

## 2016-10-20 ENCOUNTER — Encounter (INDEPENDENT_AMBULATORY_CARE_PROVIDER_SITE_OTHER): Payer: Self-pay | Admitting: Vascular Surgery

## 2016-10-20 VITALS — BP 113/62 | HR 69 | Resp 16 | Wt 173.0 lb

## 2016-10-20 DIAGNOSIS — C61 Malignant neoplasm of prostate: Secondary | ICD-10-CM | POA: Diagnosis not present

## 2016-10-20 DIAGNOSIS — E039 Hypothyroidism, unspecified: Secondary | ICD-10-CM | POA: Diagnosis not present

## 2016-10-20 DIAGNOSIS — H539 Unspecified visual disturbance: Secondary | ICD-10-CM

## 2016-10-20 NOTE — Progress Notes (Signed)
MRN : [id number]  Jordan [name] is a 83 y.o. (1924/04/20) male who presents with chief complaint of  Chief Complaint  Patient presents with  . Follow-up  .  History of Present Illness: Patient returns today in follow up of Wound check after temporal artery biopsy 2-3 weeks ago. The biopsy came back negative for temporal arteritis. His steroids have been stopped and he is gradually improving. His incision is well-healed and he has no complaints today.  Past Medical History:  Diagnosis Date  . Cancer [facility])    prostate  . Prostate cancer ([place])   . Thyroid disease          Past Surgical History:  Procedure Laterality Date  . APPENDECTOMY    . HIP CLOSED REDUCTION Left 02/08/2014   Procedure: CLOSED REDUCTION HIP;  Surgeon: Johnn Hai, MD;  Location: [facility] ORS;  Service: Orthopedics;  Laterality: Left;  . PROSTATE SURGERY    . TOTAL HIP ARTHROPLASTY           Family History  Problem Relation Age of Onset  . Cancer Father   No bleeding disorders, clotting disorders, or autoimmune diseases.  Social History     Social History  Substance Use Topics  . Smoking status: Never Smoker  . Smokeless tobacco: Never Used  . Alcohol use No  Still lives independently. No IV drug use.       Allergies  Allergen Reactions  . Penicillins     unknown  . Tetanus Toxoids     unknown          Current Outpatient Prescriptions  Medication Sig Dispense Refill  . aspirin EC 81 MG tablet Take 4 tablets (325 mg total) by mouth every evening. 60 tablet 0  . B Complex Vitamins (VITAMIN B COMPLEX IJ) Inject as directed every 30 (thirty) days.    . predniSONE (DELTASONE) 20 MG tablet     . SYNTHROID 50 MCG tablet Take 50 mcg by mouth daily before breakfast.     . vitamin E 400 UNIT capsule Take 400 Units by mouth daily.     No current facility-administered medications for this visit.       REVIEW OF SYSTEMS (Negative unless  checked)  Constitutional: _0 Weight loss  _1 Fever  _2 Chills Cardiac: _3 Chest pain   _4 Chest pressure   _5 Palpitations   _6 Shortness of breath when laying flat   _7 Shortness of breath at rest   _8 Shortness of breath with exertion. Vascular:  _9 Pain in legs with walking   _10 Pain in legs at rest   _11 Pain in legs when laying flat   _12 Claudication   _13 Pain in feet when walking  _14 Pain in feet at rest  _15 Pain in feet when laying flat   _16 History of DVT   _17 Phlebitis   _18 Swelling in legs   _19 Varicose veins   _20 Non-healing ulcers Pulmonary:   _21 Uses home oxygen   _22 Productive cough   _23 Hemoptysis   _24 Wheeze  _25 COPD   _26 Asthma Neurologic:  _27 Dizziness  _28 Blackouts   _29 Seizures   _30 History of stroke   _31 History of TIA  _32 Aphasia   _33 Temporary blindness   _34 Dysphagia   _35 Weakness or numbness in arms   _36 Weakness or numbness in legs . positive for visual loss in the right eye  Musculoskeletal:  _37 Arthritis   _38 Joint swelling   _39 Joint pain   _40 Low back pain Hematologic:  _41 Easy bruising  _42 Easy bleeding   _43 Hypercoagulable state   _44 Anemic  _45 Hepatitis Gastrointestinal:  _46 Blood in stool   _47   Vomiting blood  _0 Gastroesophageal reflux/heartburn   _1 Abdominal pain Genitourinary:  _2 Chronic kidney disease   _3 Difficult urination  _4 Frequent urination  _5 Burning with urination   _6 Hematuria Skin:  _7 Rashes   _8 Ulcers   _9 Wounds Psychological:  _10 History of anxiety   _11  History of major depression.    Physical Exam BP (!) 144/68   Pulse 65   Resp 16   Ht _12  (1.854 m)   Wt 78.9 kg (174 lb)   BMI 22.96 kg/m  Gen:  WD/WN, NAD. Appears younger than stated age. Head: [place]/AT, No temporalis wasting. Prominent temp pulse not noted. Ear/Nose/Throat: Hearing grossly intact, nares w/o erythema or drainage, oropharynx w/o Erythema/Exudate Eyes: Conjunctiva clear, sclera non-icteric  Neck: trachea midline.  No bruit or JVD.  Pulmonary:  Good air movement, clear to auscultation bilaterally.  Cardiac: RRR, normal  S1, S2, no Murmurs, rubs or gallops. Vascular:  Vessel Right Left  Radial Palpable Palpable                                   Gastrointestinal: soft, non-tender/non-distended. No guarding/reflex. No masses, surgical incisions, or scars. Musculoskeletal: M/S 5/5 throughout.  Extremities without ischemic changes.  No deformity or atrophy. Walks with a cane  Neurologic: Sensation grossly intact in extremities.  Symmetrical.  Speech is fluent. Motor exam as listed above. Psychiatric: Judgment intact, Mood & affect appropriate for pt's clinical situation. Dermatologic: Temporal artery biopsy incision is well-healed. Lymph : No Cervical, Axillary, or Inguinal lymphadenopathy.     Labs Recent Results (from the past 2160 hour(s))  Platelet count     Status: None   Collection Time: 09/18/16  4:23 PM  Result Value Ref Range   Platelets 326 150 - 440 K/uL  Sedimentation rate     Status: Abnormal   Collection Time: 09/18/16  4:23 PM  Result Value Ref Range   Sed Rate 71 (H) 0 - 20 mm/hr  C-reactive protein     Status: None   Collection Time: 09/18/16  4:23 PM  Result Value Ref Range   CRP <0.8 <1.0 mg/dL    Comment: Performed at [facility], [place] [address], [place], [place] [address]  CBC     Status: Abnormal   Collection Time: 09/25/16  1:03 PM  Result Value Ref Range   WBC 11.0 (H) 3.8 - 10.6 K/uL   RBC 3.97 (L) 4.40 - 5.90 MIL/uL   Hemoglobin 12.4 (L) 13.0 - 18.0 g/dL   HCT 35.7 (L) 40.0 - 52.0 %   MCV 90.0 80.0 - 100.0 fL   MCH 31.2 26.0 - 34.0 pg   MCHC 34.7 32.0 - 36.0 g/dL   RDW 14.5 11.5 - 14.5 %   Platelets 354 150 - 440 K/uL  Surgical pathology     Status: None   Collection Time: 09/28/16 10:52 AM  Result Value Ref Range   SURGICAL PATHOLOGY      Surgical Pathology CASE: [id number] PATIENT: Jordan [name] Surgical Pathology Report     SPECIMEN SUBMITTED: A. Temporal artery, right; biopsy  CLINICAL HISTORY: None  provided  PRE-OPERATIVE DIAGNOSIS: Temporal arteritis  POST-OPERATIVE DIAGNOSIS: Same as pre-op     DIAGNOSIS: A. RIGHT TEMPORAL ARTERY; BIOPSY: - MEDIAL CALCIFIC SCLEROSIS. - NEGATIVE FOR GIANT CELL ARTERITIS.   GROSS DESCRIPTION:  A. Labeled: right temporal artery  Tissue fragment(s): 1  Size: 2.7 x 0.2 x 0.2 cm  Description: pink to red tortuous  luminal fragment  Entirely submitted in 1 cassette(s) intact for histologic processing.    Final Diagnosis performed by Delorse Lek, MD.  Electronically signed 09/29/2016 10:50:56AM    The electronic signature indicates that the named Attending Pathologist has evaluated the specimen  Technical component performed at [facility], [address] [place], [place] [address] Lab: [phone] Dir: Darrick Penna. Evette Doffing, MD  Professional c omponent performed at [facility], [facility], [place], [place],  [address] Lab: [phone] Dir: Dellia Nims. Rubinas, MD    APTT     Status: None   Collection Time: 09/28/16 11:18 AM  Result Value Ref Range   aPTT 27 24 - 36 seconds  Basic metabolic panel     Status: Abnormal   Collection Time: 09/28/16 11:18 AM  Result Value Ref Range   Sodium 136 135 - 145 mmol/L   Potassium 3.2 (L) 3.5 - 5.1 mmol/L   Chloride 102 101 - 111 mmol/L   CO2 28 22 - 32 mmol/L   Glucose, Bld 127 (H) 65 - 99 mg/dL   BUN 21 (H) 6 - 20 mg/dL   Creatinine, Ser 0.90 0.61 - 1.24 mg/dL   Calcium 8.8 (L) 8.9 - 10.3 mg/dL   GFR calc non Af Amer >60 >60 mL/min   GFR calc Af Amer >60 >60 mL/min    Comment: (NOTE) The eGFR has been calculated using the CKD EPI equation. This calculation has not been validated in all clinical situations. eGFR's persistently <60 mL/min signify possible Chronic Kidney Disease.    Anion gap 6 5 - 15  CBC WITH DIFFERENTIAL     Status: Abnormal   Collection Time: 09/28/16 11:18 AM  Result Value Ref Range   WBC 12.6 (H) 3.8 - 10.6 K/uL   RBC 4.03 (L) 4.40  - 5.90 MIL/uL   Hemoglobin 12.1 (L) 13.0 - 18.0 g/dL   HCT 36.5 (L) 40.0 - 52.0 %   MCV 90.6 80.0 - 100.0 fL   MCH 30.0 26.0 - 34.0 pg   MCHC 33.1 32.0 - 36.0 g/dL   RDW 14.2 11.5 - 14.5 %   Platelets 363 150 - 440 K/uL   Neutrophils Relative % 88 %   Neutro Abs 11.2 (H) 1.4 - 6.5 K/uL   Lymphocytes Relative 9 %   Lymphs Abs 1.1 1.0 - 3.6 K/uL   Monocytes Relative 3 %   Monocytes Absolute 0.4 0.2 - 1.0 K/uL   Eosinophils Relative 0 %   Eosinophils Absolute 0.0 0 - 0.7 K/uL   Basophils Relative 0 %   Basophils Absolute 0.0 0 - 0.1 K/uL  Protime-INR     Status: None   Collection Time: 09/28/16 11:18 AM  Result Value Ref Range   Prothrombin Time 14.3 11.4 - 15.2 seconds   INR 1.11   Type and screen     Status: None   Collection Time: 09/28/16 11:18 AM  Result Value Ref Range   ABO/RH(D) O POS    Antibody Screen NEG    Sample Expiration 10/01/2016     Radiology No results found.    Assessment/Plan  Hypothyroidism On replacement therapy and doing well.  Prostate cancer ([place]) No current symptoms. Doing well.  Visual changes His biopsy was negative for temporal arteritis. His incision has healed. He can return to clinic on an as-needed basis.    Leotis Pain, MD  10/20/2016 11:03 AM    This note was created with Dragon medical transcription system.  Any errors from dictation are  purely unintentional

## 2016-10-20 NOTE — Assessment & Plan Note (Signed)
His biopsy was negative for temporal arteritis. His incision has healed. He can return to clinic on an as-needed basis.

## 2018-03-17 ENCOUNTER — Other Ambulatory Visit: Payer: Self-pay | Admitting: Ophthalmology

## 2018-03-17 DIAGNOSIS — H518 Other specified disorders of binocular movement: Secondary | ICD-10-CM

## 2018-03-17 DIAGNOSIS — H468 Other optic neuritis: Secondary | ICD-10-CM

## 2018-03-25 ENCOUNTER — Ambulatory Visit
Admission: RE | Admit: 2018-03-25 | Discharge: 2018-03-25 | Disposition: A | Discharge status: Home / Self Care | Payer: Medicare Other | Source: Ambulatory Visit | Attending: Ophthalmology | Admitting: Ophthalmology

## 2018-03-25 DIAGNOSIS — H468 Other optic neuritis: Secondary | ICD-10-CM

## 2018-03-25 DIAGNOSIS — H518 Other specified disorders of binocular movement: Secondary | ICD-10-CM

## 2018-03-25 MED ORDER — GADOBENATE DIMEGLUMINE 529 MG/ML IV SOLN
15.0000 mL | Freq: Once | INTRAVENOUS | Status: AC | PRN
  Administered 2018-03-25: 15 mL via INTRAVENOUS

## 2020-02-16 IMAGING — MR MR ORBITS WO/W CM
17 of 18 series · 37 of 48 positions shown · IV contrast (multihance)
Comparison: None.

CLINICAL DATA: Diplopia for 1 month. LEFT eye is affected. Recent
fall but diplopia predated the injury.

EXAM:
MRI HEAD AND ORBITS WITHOUT AND WITH CONTRAST
TECHNIQUE: Multiplanar, multiecho pulse sequences of the brain and surrounding
structures were obtained without and with intravenous contrast.
Multiplanar, multiecho pulse sequences of the orbits and surrounding
structures were obtained including fat saturation techniques, before
and after intravenous contrast administration.
CONTRAST:  15mL MULTIHANCE GADOBENATE DIMEGLUMINE 529 MG/ML IV SOLN

[Series 2: T1 · sagittal · 5.0mm · 0.45mm/px · 2 of 25 slices shown (1 of 3)]
[im 1/25]
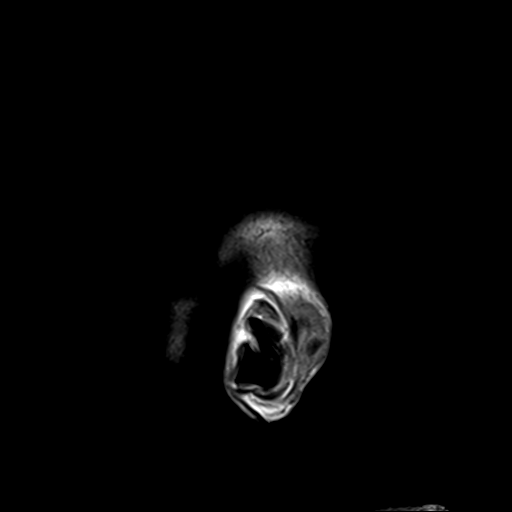
[im 25/25]
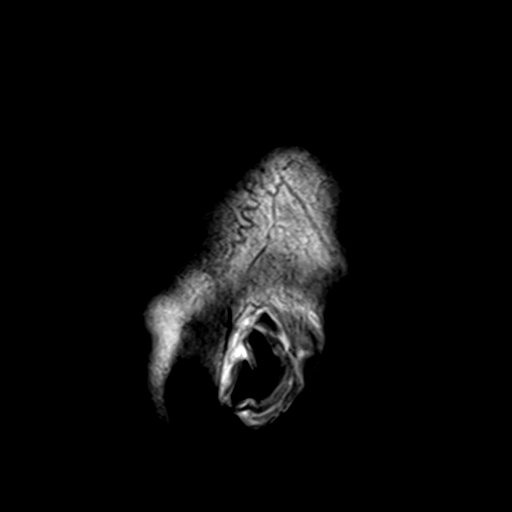

[Series 3: DWI · axial · 3.0mm · 1.80mm/px · z∈[-42,+110]mm · 5 of 103 slices shown (1 of 4)]
[im 1/103]
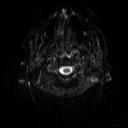
[im 26/103]
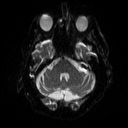
[im 52/103]
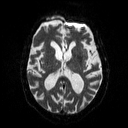
[im 77/103]
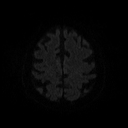
[im 103/103]
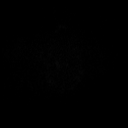

[Series 4: DWI · axial · 3.0mm · 1.80mm/px · z∈[-42,+110]mm · 3 of 52 slices shown (2 of 4)]
[im 1/52]
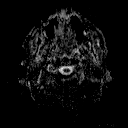
[im 26/52]
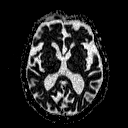
[im 52/52]
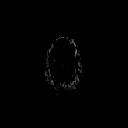

[Series 5: DWI · coronal · 5.0mm · 1.80mm/px · 4 of 76 slices shown (3 of 4)]
[im 1/76]
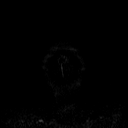
[im 26/76]
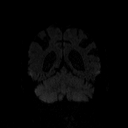
[im 51/76]
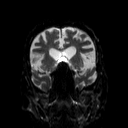
[im 76/76]
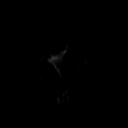

[Series 6: DWI · coronal · 5.0mm · 1.80mm/px · 2 of 38 slices shown (4 of 4)]
[im 1/38]
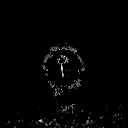
[im 38/38]
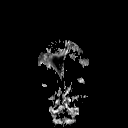

[Series 7: FLAIR · axial · 3.0mm · 0.45mm/px · 1 of 26 slices shown]
[im 1/26]
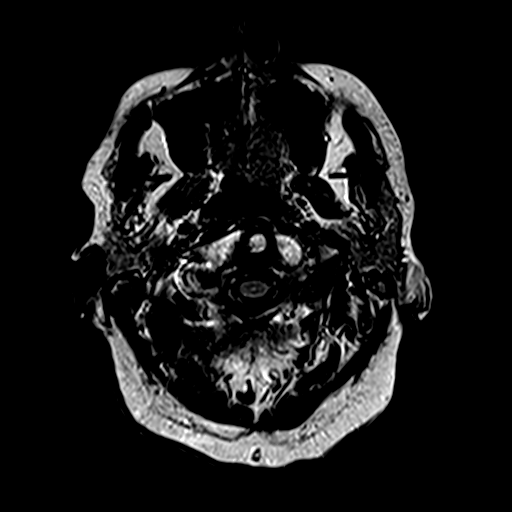

[Series 8: T2 · axial · 5.0mm · 0.51mm/px · 1 of 24 slices shown (1 of 2)]
[im 1/24]
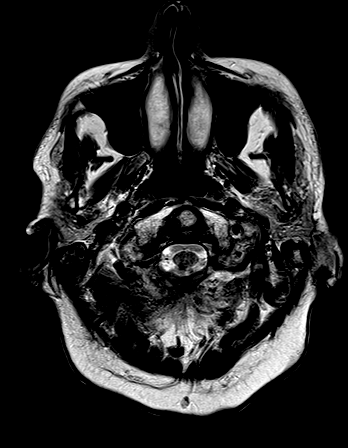

[Series 10: swi_images · axial · 2.0mm · 0.90mm/px · z∈[-43,+113]mm · 4 of 80 slices shown]
[im 1/80]
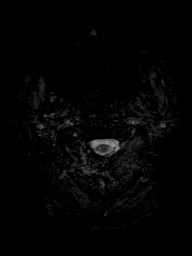
[im 27/80]
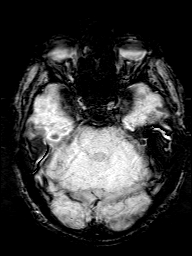
[im 53/80]
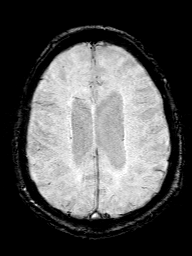
[im 80/80]
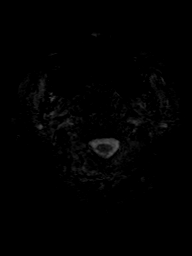

[Series 11: t1_mpr_tra copy center · axial · 1.0mm · 0.45mm/px · z∈[-47,+43]mm · 5 of 160 slices shown]
[im 1/160]
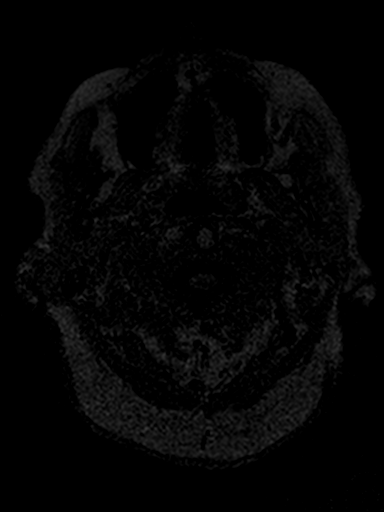
[im 23/160]
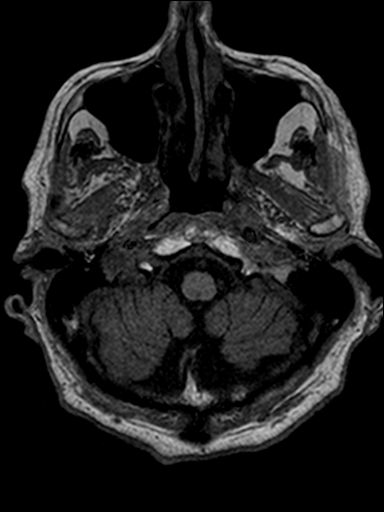
[im 46/160]
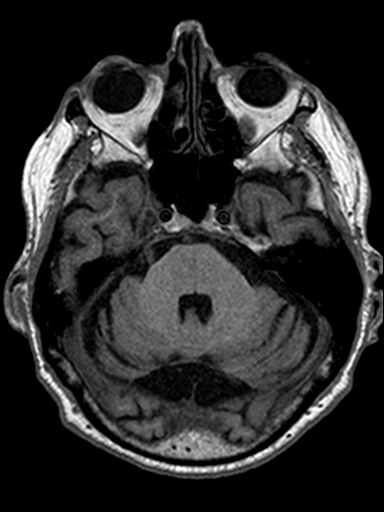
[im 69/160]
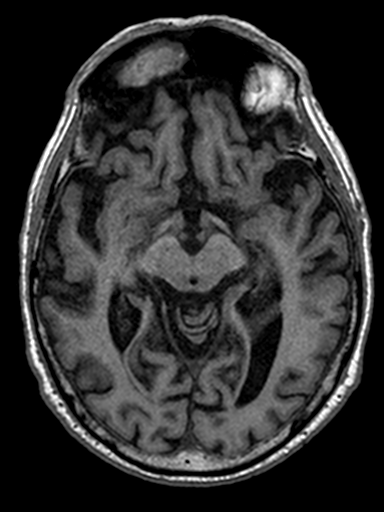
[im 91/160]
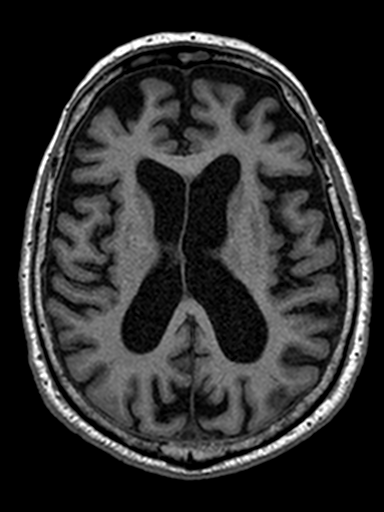

[Series 12: T2 · coronal · 5.0mm · 0.45mm/px · 2 of 33 slices shown (2 of 2)]
[im 1/33]
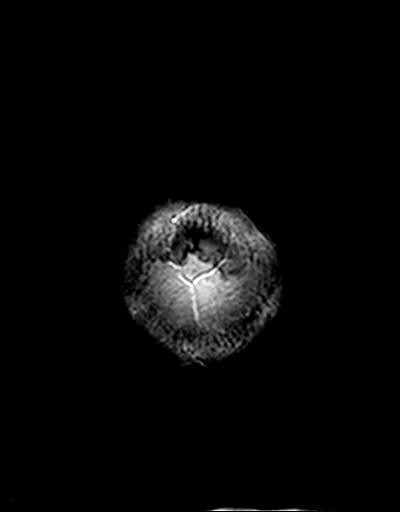
[im 33/33]
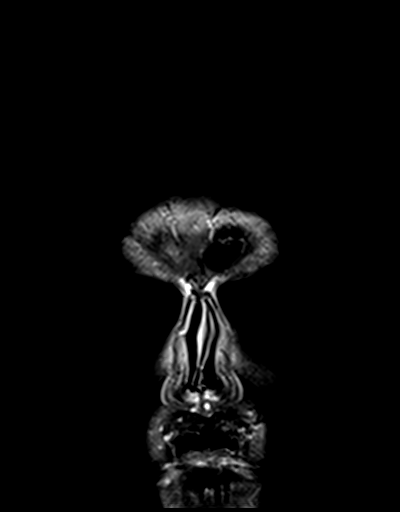

[Series 13: T1 · coronal · 3.0mm · 0.35mm/px · 1 of 26 slices shown (2 of 3)]
[im 1/26]
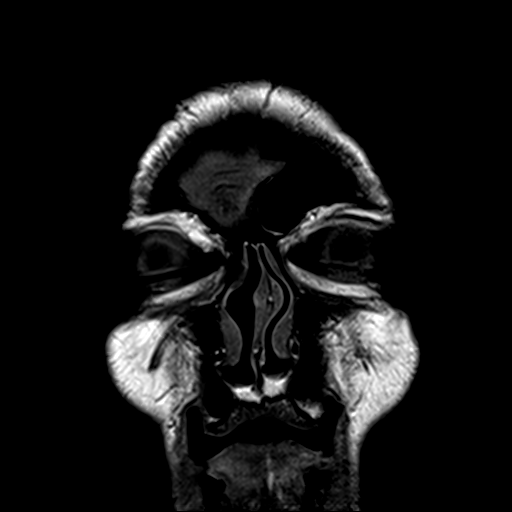

[Series 14: T1 · axial · 3.0mm · 0.35mm/px · 1 of 16 slices shown (3 of 3)]
[im 1/16]
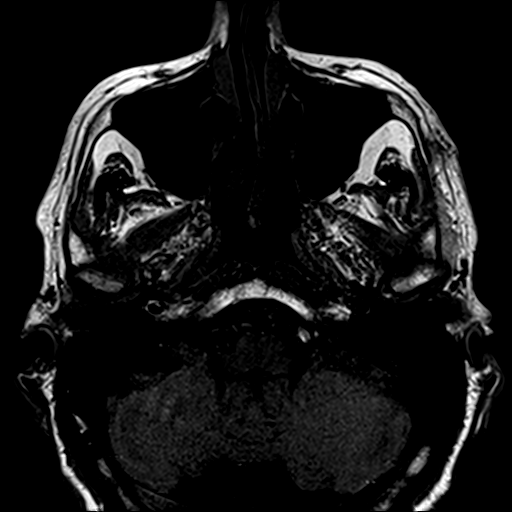

[Series 15: T2 fat-sat · coronal · 3.0mm · 0.35mm/px · 1 of 26 slices shown (1 of 2)]
[im 1/26]
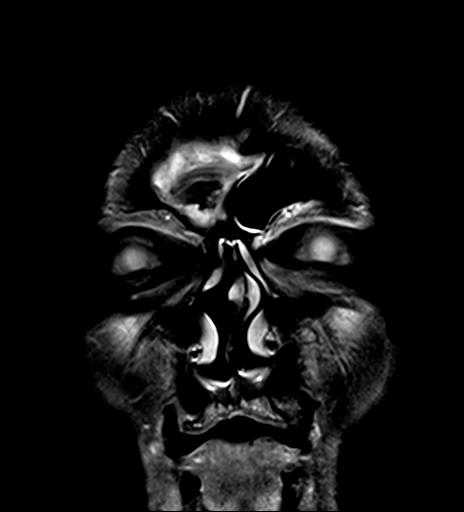

[Series 16: T2 fat-sat · axial · 3.0mm · 0.47mm/px · 1 of 16 slices shown (2 of 2)]
[im 1/16]
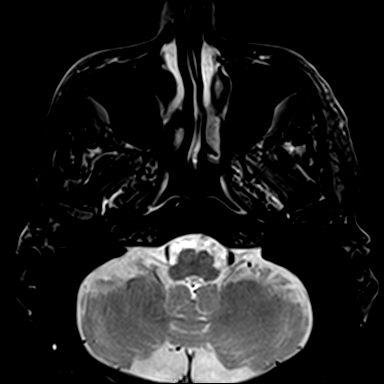

[Series 18: T1 post-contrast · coronal · 5.0mm · 0.45mm/px · 2 of 33 slices shown]
[im 1/33]
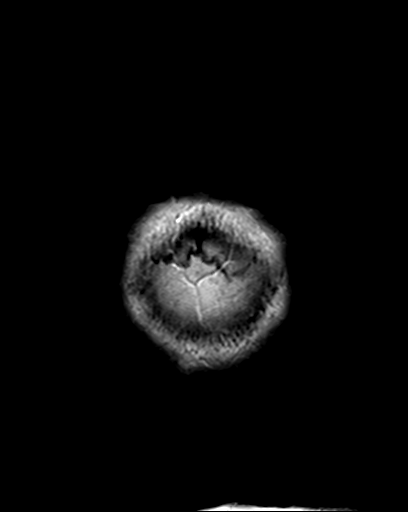
[im 33/33]
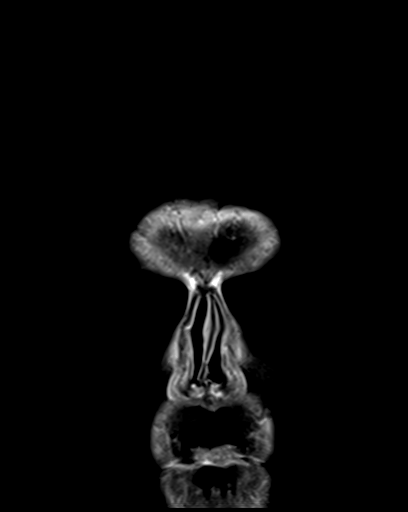

[Series 19: T1 fat-sat post-contrast · coronal · 3.0mm · 0.35mm/px · 1 of 26 slices shown (1 of 2)]
[im 1/26]
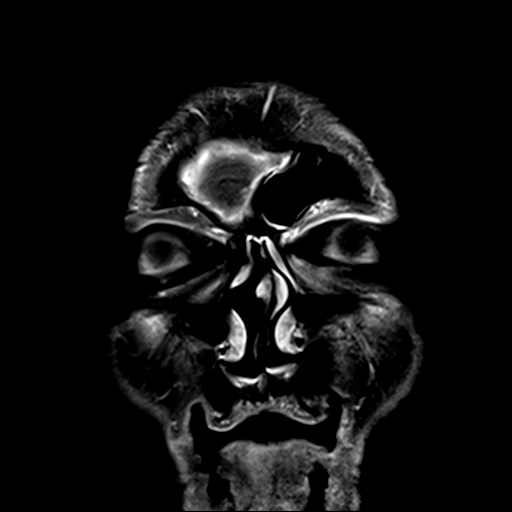

[Series 20: T1 fat-sat post-contrast · axial · 3.0mm · 0.35mm/px · 1 of 16 slices shown (2 of 2)]
[im 1/16]
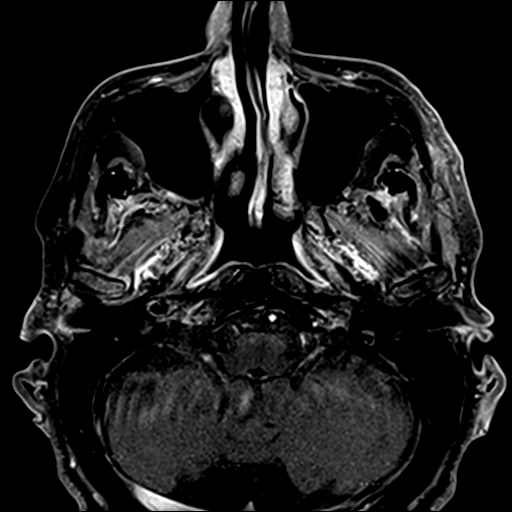

[37 of 48 positions shown; findings below may reference images not displayed]

FINDINGS: MRI HEAD FINDINGS

Brain: No evidence for acute infarction, hemorrhage, mass lesion, or
extra-axial fluid. Generalized atrophy. Hydrocephalus ex vacuo. Mild
subcortical and periventricular T2 and FLAIR hyperintensities,
likely chronic microvascular ischemic change.

Post infusion, no abnormal enhancement of the brain or meninges.

Cavernous sinuses are well visualized, demonstrate no mass or
orbital apex abnormality.

Vascular: Normal flow voids. No evidence for saccular aneurysm at
the skull base or at the orbital apex.

Skull and upper cervical spine: Normal marrow signal. No evidence
for metastatic prostate cancer. Retrocerebellar/mega cisterna magna.
Normal chiasm and pituitary.

Other: None.

MRI ORBITS FINDINGS

Orbits: No traumatic or inflammatory finding. Globes, optic nerves,
orbital fat, extraocular muscles, vascular structures, and lacrimal
glands are normal. BILATERAL cataract extraction.

Visualized sinuses: Complete filling of the RIGHT frontal sinus and
RIGHT anterior ethmoid air cells with fluid, surrounding enhanced
mucosal thickening, suspected early frontoethmoid mucocele. No
orbital encroachment.

Soft tissues: Negative.  No inflammatory or neoplastic process.

Limited intracranial: Reported separately.
IMPRESSION: Generalized atrophy. Mild small vessel disease. No acute
intracranial findings. No abnormal postcontrast enhancement. No
posttraumatic sequelae are evident.

MRI of the orbits demonstrates no acute or focal process. No
intracranial or orbital cause for diplopia is identified.

Suspected developing RIGHT frontoethmoid mucocele. Elective ENT
consultation is warranted.

## 2020-07-31 DEATH — deceased
